# Patient Record
Sex: Male | Born: 1960 | Race: White | Hispanic: No | Marital: Married | Smoking: Never smoker
Health system: Southern US, Community
[De-identification: ages and names within clinical notes are randomized; demographics above are authoritative.]

## PROBLEM LIST (undated history)

## (undated) DIAGNOSIS — E785 Hyperlipidemia, unspecified: Secondary | ICD-10-CM

## (undated) DIAGNOSIS — Z860100 Personal history of colon polyps, unspecified: Secondary | ICD-10-CM

## (undated) DIAGNOSIS — I10 Essential (primary) hypertension: Secondary | ICD-10-CM

## (undated) HISTORY — PX: POLYPECTOMY: SHX149

## (undated) HISTORY — DX: Hyperlipidemia, unspecified: E78.5

## (undated) HISTORY — DX: Essential (primary) hypertension: I10

## (undated) HISTORY — PX: VASECTOMY: SHX75

## (undated) HISTORY — DX: Personal history of colonic polyps: Z86.010

## (undated) HISTORY — PX: COLONOSCOPY: SHX174

## (undated) HISTORY — PX: UPPER GASTROINTESTINAL ENDOSCOPY: SHX188

## (undated) HISTORY — DX: Personal history of colon polyps, unspecified: Z86.0100

---

## 2001-01-02 ENCOUNTER — Encounter: Admission: RE | Admit: 2001-01-02 | Discharge: 2001-01-02 | Payer: Self-pay | Admitting: Family Medicine

## 2001-01-02 ENCOUNTER — Encounter: Payer: Self-pay | Admitting: Family Medicine

## 2004-07-15 ENCOUNTER — Encounter: Admission: RE | Admit: 2004-07-15 | Discharge: 2004-07-15 | Payer: Self-pay | Admitting: Gastroenterology

## 2010-09-08 ENCOUNTER — Encounter (INDEPENDENT_AMBULATORY_CARE_PROVIDER_SITE_OTHER): Payer: Self-pay | Admitting: *Deleted

## 2010-10-04 ENCOUNTER — Encounter (INDEPENDENT_AMBULATORY_CARE_PROVIDER_SITE_OTHER): Payer: Self-pay

## 2010-10-05 ENCOUNTER — Ambulatory Visit: Payer: Self-pay | Admitting: Gastroenterology

## 2010-10-12 ENCOUNTER — Telehealth: Payer: Self-pay | Admitting: Gastroenterology

## 2010-10-19 ENCOUNTER — Ambulatory Visit: Payer: Self-pay | Admitting: Gastroenterology

## 2010-10-25 ENCOUNTER — Encounter: Payer: Self-pay | Admitting: Gastroenterology

## 2010-11-28 ENCOUNTER — Encounter: Payer: Self-pay | Admitting: Gastroenterology

## 2010-12-09 NOTE — Procedures (Signed)
Summary: Colonoscopy  Patient: Stephen Hines Note: All result statuses are Final unless otherwise noted.  Tests: (1) Colonoscopy (COL)   COL Colonoscopy           DONE     [facility]     [address]     [place], [place]  [address]           COLONOSCOPY PROCEDURE REPORT           PATIENT:  Stephen Hines, Stephen Hines  MR#:  [id number]     BIRTHDATE:  26-Jan-1961, 49 yrs. old  GENDER:  male           ENDOSCOPIST:  Barbette Hair. Arlyce Dice, MD     Referred by:  Bradd Canary, M.D.           PROCEDURE DATE:  10/19/2010     PROCEDURE:  Colonoscopy with snare polypectomy     ASA CLASS:  Class I     INDICATIONS:  1) Routine Risk Screening           MEDICATIONS:   Fentanyl 75 mcg IV, Versed 8 mg IV           DESCRIPTION OF PROCEDURE:   After the risks benefits and     alternatives of the procedure were thoroughly explained, informed     consent was obtained.  Digital rectal exam was performed and     revealed no abnormalities.   The [id number] [id number] endoscope was     introduced through the anus and advanced to the cecum, which was     identified by both the appendix and ileocecal valve, without     limitations.  The quality of the prep was good, using MoviPrep.     The instrument was then slowly withdrawn as the colon was fully     examined.     <<PROCEDUREIMAGES>>           FINDINGS:  A sessile polyp was found in the distal transverse     colon. It was 6 mm in size. Polyp was snared without cautery.     Retrieval was successful (see image1 and image2). snare polyp     This was otherwise a normal examination of the colon (see image4,     image5, image7, image9, image10, and image11).   Retroflexed views     in the rectum revealed no abnormalities.    The time to cecum =     5.50  minutes. The scope was then withdrawn (time =  6.50  min)     from the patient and the procedure completed.           COMPLICATIONS:  None           ENDOSCOPIC IMPRESSION:     1) 6 mm sessile polyp in the distal  transverse colon     2) Otherwise normal examination     RECOMMENDATIONS:     1) If the polyp(s) removed today are proven to be adenomatous     (pre-cancerous) polyps, you will need a repeat colonoscopy in 5     years. Otherwise you should continue to follow colorectal cancer     screening guidelines for "routine risk" patients with colonoscopy     in 10 years.           REPEAT EXAM:   You will receive a letter from Dr. Arlyce Dice in 1-2     weeks, after reviewing the final pathology, with followup  recommendations.           ______________________________     Barbette Hair Arlyce Dice, MD           CC:           n.     eSIGNED:   Barbette Hair. Kaplan at 10/19/2010 11:29 AM           Lucienne Minks, [id number]  Note: An exclamation mark (!) indicates a result that was not dispersed into the flowsheet. Document Creation Date: 10/19/2010 11:29 AM _______________________________________________________________________  (1) Order result status: Final Collection or observation date-time: 10/19/2010 11:25 Requested date-time:  Receipt date-time:  Reported date-time:  Referring Physician:   Ordering Physician: Melvia Heaps [phone]) Specimen Source:  Source: Launa Grill Order Number: [id number] Lab site:   Appended Document: Colonoscopy     Procedures Next Due Date:    Colonoscopy: 10/2015

## 2010-12-09 NOTE — Letter (Signed)
Summary: [facility] Instructions  [facility]  [address] [place], [place] [address]   Phone: [phone]  Fax: [phone]       Stephen Hines    [birth year]    MRN: [id number]        Procedure Day /Date:  10/19/10     Arrival Time:  9:30am      Procedure Time:  10:30am     Location of Procedure:                    _x _  [facility] (4th Floor)                        PREPARATION FOR COLONOSCOPY WITH MOVIPREP   Starting 5 days prior to your procedure _ 12/8/11_ do not eat nuts, seeds, popcorn, corn, beans, peas,  salads, or any raw vegetables.  Do not take any fiber supplements (e.g. Metamucil, Citrucel, and Benefiber).  THE DAY BEFORE YOUR PROCEDURE         DATE:  10/18/10  DAY:  Monday  1.  Drink clear liquids the entire day-NO SOLID FOOD  2.  Do not drink anything colored red or purple.  Avoid juices with pulp.  No orange juice.  3.  Drink at least 64 oz. (8 glasses) of fluid/clear liquids during the day to prevent dehydration and help the prep work efficiently.  CLEAR LIQUIDS INCLUDE: Water Jello Ice Popsicles Tea (sugar ok, no milk/cream) Powdered fruit flavored drinks Coffee (sugar ok, no milk/cream) Gatorade Juice: apple, white grape, white cranberry  Lemonade Clear bullion, consomm, broth Carbonated beverages (any kind) Strained chicken noodle soup Hard Candy                             4.  In the morning, mix first dose of MoviPrep solution:    Empty 1 Pouch A and 1 Pouch B into the disposable container    Add lukewarm drinking water to the top line of the container. Mix to dissolve    Refrigerate (mixed solution should be used within 24 hrs)  5.  Begin drinking the prep at 5:00 p.m. The MoviPrep container is divided by 4 marks.   Every 15 minutes drink the solution down to the next mark (approximately 8 oz) until the full liter is complete.   6.  Follow completed prep with 16 oz of clear liquid of your choice (Nothing  red or purple).  Continue to drink clear liquids until bedtime.  7.  Before going to bed, mix second dose of MoviPrep solution:    Empty 1 Pouch A and 1 Pouch B into the disposable container    Add lukewarm drinking water to the top line of the container. Mix to dissolve    Refrigerate  THE DAY OF YOUR PROCEDURE      DATE:   10/19/10  DAY:  Tuesday  Beginning at 5:30 a.m. (5 hours before procedure):         1. Every 15 minutes, drink the solution down to the next mark (approx 8 oz) until the full liter is complete.  2. Follow completed prep with 16 oz. of clear liquid of your choice.    3. You may drink clear liquids until  8:30am  (2 HOURS BEFORE PROCEDURE).   MEDICATION INSTRUCTIONS  Unless otherwise instructed, you should take regular prescription medications with a small sip of water  as early as possible the morning of your procedure.         OTHER INSTRUCTIONS  You will need a responsible adult at least 51 years of age to accompany you and drive you home.   This person must remain in the waiting room during your procedure.  Wear loose fitting clothing that is easily removed.  Leave jewelry and other valuables at home.  However, you may wish to bring a book to read or  an iPod/MP3 player to listen to music as you wait for your procedure to start.  Remove all body piercing jewelry and leave at home.  Total time from sign-in until discharge is approximately 2-3 hours.  You should go home directly after your procedure and rest.  You can resume normal activities the  day after your procedure.  The day of your procedure you should not:   Drive   Make legal decisions   Operate machinery   Drink alcohol   Return to work  You will receive specific instructions about eating, activities and medications before you leave.    The above instructions have been reviewed and explained to me by   Ulis Rias RN  October 05, 2010 10:44 AM     I fully  understand and can verbalize these instructions _____________________________ Date _________

## 2010-12-09 NOTE — Miscellaneous (Signed)
Summary: Lec previsit  Clinical Lists Changes  Medications: Added new medication of MOVIPREP 100 GM  SOLR (PEG-KCL-NACL-NASULF-NA ASC-C) As per prep instructions. - Signed Rx of MOVIPREP 100 GM  SOLR (PEG-KCL-NACL-NASULF-NA ASC-C) As per prep instructions.;  #1 x 0;  Signed;  Entered by: Ulis Rias RN;  Authorized by: Louis Meckel MD;  Method used: Electronically to [facility] [phone]*, [address] [place], [place]  [address], Ph: [phone] or [phone], Fax: [phone] Observations: Added new observation of NKA: T (10/05/2010 10:13)    Prescriptions: MOVIPREP 100 GM  SOLR (PEG-KCL-NACL-NASULF-NA ASC-C) As per prep instructions.  #1 x 0   Entered by:   Ulis Rias RN   Authorized by:   Louis Meckel MD   Signed by:   Ulis Rias RN on 10/05/2010   Method used:   [facility]ronically to        [facility] [phone]* (retail)       [address]       [place], [place]  [address]       Ph: [phone] or [phone]       Fax: [phone]   RxID:   [id number]

## 2010-12-09 NOTE — Progress Notes (Signed)
Summary: Questions  Phone Note Call from Patient Call back at [phone]   Caller: Patient Call For: Dr. Arlyce Dice Reason for Call: Talk to Nurse Summary of Call: pt has colon scheduled for next week and wants to make sure there is no problem with him getting his blood drawn this afternoon Initial call taken by: [place] Johnson,  October 12, 2010 2:01 PM  Follow-up for Phone Call        All questions from pt answered Follow-up by: Karl Bales RN,  October 12, 2010 2:17 PM

## 2010-12-09 NOTE — Letter (Signed)
Summary: Patient Notice- Polyp Results  [facility]  [address] [place], [place] [address]   Phone: [phone]  Fax: [phone]        October 25, 2010 MRN: [id number]    AAIDEN DEPOY [address] [place], [place]  [address]    Dear Mr. SHEPHERD,  I am pleased to inform you that the colon polyp(s) removed during your recent colonoscopy was (were) found to be benign (no cancer detected) upon pathologic examination.  I recommend you have a repeat colonoscopy examination in 5_ years to look for recurrent polyps, as having colon polyps increases your risk for having recurrent polyps or even colon cancer in the future.  Should you develop new or worsening symptoms of abdominal pain, bowel habit changes or bleeding from the rectum or bowels, please schedule an evaluation with either your primary care physician or with me.  Additional information/recommendations:  __ No further action with gastroenterology is needed at this time. Please      follow-up with your primary care physician for your other healthcare      needs.  __ Please call [phone] to schedule a return visit to review your      situation.  __ Please keep your follow-up visit as already scheduled.  _x_ Continue treatment plan as outlined the day of your exam.  Please call us if you are having persistent problems or have questions about your condition that have not been fully answered at this time.  Sincerely,  Louis Meckel MD  This letter has been electronically signed by your physician.  Appended Document: Patient Notice- Polyp Results Letter Mailed

## 2010-12-09 NOTE — Letter (Signed)
Summary: Pre Visit Letter Revised  [facility]  [address] [place], [place] [address]   Phone: [phone]  Fax: [phone]        09/08/2010 MRN: [id number] Stephen Hines [address] [place], [place]  [address]             Procedure Date:  10-19-10   Welcome to the Gastroenterology Division at [facility].    You are scheduled to see a nurse for your pre-procedure visit on 10-05-10 at 10:30A.M. on the 3rd floor at [facility], [address]  We ask that you try to arrive at our office 15 minutes prior to your appointment time to allow for check-in.  Please take a minute to review the attached form.  If you answer "Yes" to one or more of the questions on the first page, we ask that you call the person listed at your earliest opportunity.  If you answer "No" to all of the questions, please complete the rest of the form and bring it to your appointment.    Your nurse visit will consist of discussing your medical and surgical history, your immediate family medical history, and your medications.   If you are unable to list all of your medications on the form, please bring the medication bottles to your appointment and we will list them.  We will need to be aware of both prescribed and over the counter drugs.  We will need to know exact dosage information as well.    Please be prepared to read and sign documents such as consent forms, a financial agreement, and acknowledgement forms.  If necessary, and with your consent, a friend or relative is welcome to sit-in on the nurse visit with you.  Please bring your insurance card so that we may make a copy of it.  If your insurance requires a referral to see a specialist, please bring your referral form from your primary care physician.  No co-pay is required for this nurse visit.     If you cannot keep your appointment, please call [phone] to cancel or reschedule prior to your appointment date.  This  allows [place] the opportunity to schedule an appointment for another patient in need of care.    Thank you for choosing  Gastroenterology for your medical needs.  We appreciate the opportunity to care for you.  Please visit [place] at our website  to learn more about our practice.  Sincerely, The Gastroenterology Division

## 2012-09-12 ENCOUNTER — Other Ambulatory Visit: Payer: Self-pay | Admitting: Dermatology

## 2012-11-09 ENCOUNTER — Ambulatory Visit (INDEPENDENT_AMBULATORY_CARE_PROVIDER_SITE_OTHER): Payer: BC Managed Care – PPO | Admitting: Emergency Medicine

## 2012-11-09 VITALS — BP 133/82 | HR 108 | Temp 99.3°F | Resp 16 | Ht 74.0 in | Wt 266.0 lb

## 2012-11-09 DIAGNOSIS — J111 Influenza due to unidentified influenza virus with other respiratory manifestations: Secondary | ICD-10-CM

## 2012-11-09 DIAGNOSIS — R059 Cough, unspecified: Secondary | ICD-10-CM

## 2012-11-09 DIAGNOSIS — R05 Cough: Secondary | ICD-10-CM

## 2012-11-09 DIAGNOSIS — J3481 Nasal mucositis (ulcerative): Secondary | ICD-10-CM

## 2012-11-09 LAB — POCT INFLUENZA A/B
Influenza A, POC: NEGATIVE
Influenza B, POC: NEGATIVE

## 2012-11-09 MED ORDER — OSELTAMIVIR PHOSPHATE 75 MG PO CAPS: 75.0000 mg | ORAL_CAPSULE | Freq: Two times a day (BID) | ORAL | Status: DC

## 2012-11-09 MED ORDER — HYDROCOD POLST-CHLORPHEN POLST 10-8 MG/5ML PO LQCR: 5.0000 mL | Freq: Two times a day (BID) | ORAL | Status: DC | PRN

## 2012-11-09 NOTE — Patient Instructions (Addendum)

## 2012-11-09 NOTE — Progress Notes (Signed)
[facility] [address], [place] [address] [phone]  Date:  11/09/2012   Name:  Stephen Hines   DOB:  1961/06/07   MRN:  [id number]  PCP:  Barkley Bruns, MD    Chief Complaint: Illness   History of Present Illness:  Stephen Hines is a 52 y.o. very pleasant male patient who presents with the following:  Started to feel puny after work yesterday and went to bed immediately.  Today feels worse.  Myalgias, malaise.  Cough yesterday but less today. Non productive.  Some nasal congestion and drainage that is mucoid.  Fever at home and some chills.  No improvement with OTC medication.  Had a flu shot.  No sick contacts.  There is no problem list on file for this patient.   Past Medical History  Diagnosis Date  . Hyperlipidemia     Past Surgical History  Procedure Date  . Vasectomy     History  Substance Use Topics  . Smoking status: Never Smoker   . Smokeless tobacco: Not on file  . Alcohol Use: No    History reviewed. No pertinent family history.  No Known Allergies  Medication list has been reviewed and updated.  Current Outpatient Prescriptions on File Prior to Visit  Medication Sig Dispense Refill  . atorvastatin (LIPITOR) 40 MG tablet Take 40 mg by mouth daily.        Review of Systems:  As per HPI, otherwise negative.    Physical Examination: Filed Vitals:   11/09/12 1721  BP: 133/82  Pulse: 108  Temp: 99.3 F (37.4 C)  Resp: 16   Filed Vitals:   11/09/12 1721  Height: 6\' 2"  (1.88 m)  Weight: 266 lb (120.657 kg)   Body mass index is 34.15 kg/(m^2). Ideal Body Weight: Weight in (lb) to have BMI = 25: 194.3   GEN: WDWN, NAD, Non-toxic, A & O x 3 HEENT: Atraumatic, Normocephalic. Neck supple. No masses, No LAD. Ears and Nose: No external deformity. CV: RRR, No M/G/R. No JVD. No thrill. No extra heart sounds. PULM: CTA B, no wheezes, crackles, rhonchi. No retractions. No resp. distress. No accessory muscle  use. ABD: S, NT, ND, +BS. No rebound. No HSM. EXTR: No c/c/e NEURO Normal gait.  PSYCH: Normally interactive. Conversant. Not depressed or anxious appearing.  Calm demeanor.    Assessment and Plan: Influenza tamiflu IAW CDC guidance to treat suspicious cases with negative POCT  Follow up as needed   Carmelina Dane, MD  Results for orders placed in visit on 11/09/12  POCT INFLUENZA A/B      Component Value Range   Influenza A, POC Negative     Influenza B, POC Negative

## 2013-10-01 ENCOUNTER — Other Ambulatory Visit: Payer: Self-pay | Admitting: Dermatology

## 2013-10-24 ENCOUNTER — Other Ambulatory Visit: Payer: Self-pay | Admitting: Emergency Medicine

## 2014-05-22 ENCOUNTER — Ambulatory Visit (INDEPENDENT_AMBULATORY_CARE_PROVIDER_SITE_OTHER): Payer: BC Managed Care – PPO

## 2014-05-22 ENCOUNTER — Ambulatory Visit (INDEPENDENT_AMBULATORY_CARE_PROVIDER_SITE_OTHER): Payer: BC Managed Care – PPO | Admitting: Family Medicine

## 2014-05-22 VITALS — BP 126/80 | HR 79 | Temp 98.0°F | Resp 16 | Ht 73.5 in | Wt 267.0 lb

## 2014-05-22 DIAGNOSIS — M79609 Pain in unspecified limb: Secondary | ICD-10-CM

## 2014-05-22 DIAGNOSIS — M79674 Pain in right toe(s): Secondary | ICD-10-CM

## 2014-05-22 DIAGNOSIS — M7989 Other specified soft tissue disorders: Secondary | ICD-10-CM

## 2014-05-22 MED ORDER — MELOXICAM 7.5 MG PO TABS: 7.5000 mg | ORAL_TABLET | Freq: Every day | ORAL | Status: DC | PRN

## 2014-05-22 NOTE — Patient Instructions (Signed)
The joint swelling may be gout or "pseudogout".  Can take meloxicam - 1 to 2 each day. Do not combine with other antiinflammatories.  Elevate as needed. Can follow up with your primary provider to check testing for gout if this has not been tested lately - especially if any recurrence of joint swelling.  Recheck in next week if not much improved - sooner if worse.   Return to the clinic or go to the nearest emergency room if any of your symptoms worsen or new symptoms occur.

## 2014-05-22 NOTE — Progress Notes (Addendum)
Subjective:  This chart was scribed for Stephen Ray, MD, by Stephen Hines, ED Scribe. This  patient's care was started at 8:17 PM.   Authored by Stephen Forehand, MD - unable to change in [facility].    Patient ID: Stephen Hines, male    DOB: [birth year], 53 y.o.   MRN: [id number]  HPI   Stephen Hines is a 53 y.o. male PCP: Stephen Good, MD  The pt reports persistent pain to his right great toe which began 8 days ago upon awakening and has been associated with intermittent swelling. He has used IBU with moderate relief; he uses 400 mg IBU every six hours. He denies drainage from the toe. He denies fever, chills, or cold symptoms. He denies known injuries or bites to the toe. He also denies a h/o gout. He reports recent increased ambulation as part of his daily activities. He also reports increased red meat consumption recently while on a vacation in [place].   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   There are no active problems to display for this patient.  Past Medical History  Diagnosis Date   Hyperlipidemia    Past Surgical History  Procedure Laterality Date   Vasectomy     No Known Allergies  Prior to Admission medications   Medication Sig Start Date End Date Taking? Authorizing Provider  atorvastatin (LIPITOR) 40 MG tablet Take 40 mg by mouth daily.   Yes Historical Provider, MD  chlorpheniramine-HYDROcodone (TUSSIONEX PENNKINETIC ER) 10-8 MG/5ML LQCR Take 5 mLs by mouth every 12 (twelve) hours as needed (cough). 11/09/12   Stephen Carwin, MD  oseltamivir (TAMIFLU) 75 MG capsule Take 1 capsule (75 mg total) by mouth 2 (two) times daily. 11/09/12   Stephen Carwin, MD   Review of Systems  Constitutional: Negative for fever and chills.  HENT: Negative for congestion, rhinorrhea, sneezing and sore throat.   Respiratory: Negative for cough.   Musculoskeletal: Positive for myalgias.  Skin: Positive for color change. Negative for rash.      Objective:   Physical  Exam  Nursing note and vitals reviewed. Constitutional: He is oriented to person, place, and time. He appears well-developed and well-nourished. No distress.  HENT:  Head: Normocephalic and atraumatic.  Eyes: Conjunctivae and EOM are normal.  Neck: Neck supple. No tracheal deviation present.  Cardiovascular: Normal rate.   Pulmonary/Chest: Effort normal. No respiratory distress.  Musculoskeletal: Normal range of motion.  Compared to left foot, right great toe has some soft tissue swelling, some erythema and warmth over dorsal IP joint. TP joint is unaffected. Proximal and lateral nail folds are unaffected. No wounds to base of foot. No skin break down on plantar or medial surface of great toe. No rash. No proximal erythematous streaks.   Neurological: He is alert and oriented to person, place, and time.  Skin: Skin is warm and dry.  Psychiatric: He has a normal mood and affect. His behavior is normal.    Filed Vitals:   05/22/14 2013  BP: 126/80  Pulse: 79  Temp: 98 F (36.7 C)  TempSrc: Oral  Resp: 16  Height: 6' 1.5" (1.867 m)  Weight: 267 lb (121.11 kg)  SpO2: 96%   [facility] reading (PRIMARY) by  Dr. Carlota Hines: R great toe.: no acute findings, no fx.      Assessment & Plan:   Stephen Hines is a 53 y.o. male Great toe pain, right - Plan: DG Toe Great Right, meloxicam (MOBIC) 7.5 MG  tablet  Pain and swelling of toe of right foot - Plan: meloxicam (MOBIC) 7.5 MG tablet  Possible initial onset of gout or other inflammatory arthropathy. No sign of infection, and skin surrounding nail unaffected - doubt infection. Prior increased steak intake - may be gout.   -Change to mobic 1-2 qd, elevation as able, sx care and follow up with PCP for further gout testing if has not had recent uric acid testing.   -rtc precautions.     Meds ordered this encounter  Medications   meloxicam (MOBIC) 7.5 MG tablet    Sig: Take 1-2 tablets (7.5-15 mg total) by mouth daily as needed for pain.     Dispense:  30 tablet    Refill:  0   Patient Instructions  The joint swelling may be gout or "pseudogout".  Can take meloxicam - 1 to 2 each day. Do not combine with other antiinflammatories.  Elevate as needed. Can follow up with your primary provider to check testing for gout if this has not been tested lately - especially if any recurrence of joint swelling.  Recheck in next week if not much improved - sooner if worse.   Return to the clinic or go to the nearest emergency room if any of your symptoms worsen or new symptoms occur.     I personally performed the services described in this documentation, which was scribed in my presence. The recorded information has been reviewed and considered, and addended by me as needed.

## 2014-06-06 ENCOUNTER — Ambulatory Visit: Payer: BC Managed Care – PPO | Admitting: Podiatrist

## 2014-09-15 ENCOUNTER — Ambulatory Visit (INDEPENDENT_AMBULATORY_CARE_PROVIDER_SITE_OTHER): Payer: BC Managed Care – PPO | Admitting: Family Medicine

## 2014-09-15 VITALS — BP 124/78 | HR 81 | Temp 98.7°F | Resp 18 | Ht 74.0 in | Wt 269.2 lb

## 2014-09-15 DIAGNOSIS — J029 Acute pharyngitis, unspecified: Secondary | ICD-10-CM

## 2014-09-15 DIAGNOSIS — R05 Cough: Secondary | ICD-10-CM

## 2014-09-15 DIAGNOSIS — R059 Cough, unspecified: Secondary | ICD-10-CM

## 2014-09-15 LAB — POCT RAPID STREP A (OFFICE): RAPID STREP A SCREEN: NEGATIVE

## 2014-09-15 MED ORDER — HYDROCODONE-HOMATROPINE 5-1.5 MG/5ML PO SYRP: 5.0000 mL | ORAL_SOLUTION | ORAL | Status: DC | PRN

## 2014-09-15 MED ORDER — BENZONATATE 100 MG PO CAPS: 100.0000 mg | ORAL_CAPSULE | Freq: Three times a day (TID) | ORAL | Status: DC | PRN

## 2014-09-15 NOTE — Progress Notes (Signed)
Subjective: 53 year old man who is generally healthy. He had his physical exam a week or 2 ago and was normal. The last couple days he is started getting ill with a slight sore throat. He did okay Saturday and Sunday, but today he felt lousy. He did not go to work. He has not had a documented fever. He is coughing some. Has had a runny nose, and is bringing up some mucus.  Objective: No major distress. His TMs are normal. Throat erythematous without exudate. Neck supple without major nodes. His chest is clear to auscultation. Heart regular without murmurs.  Assessment: Pharyngitis Cough  Plan: Strep test and culture if needed  Results for orders placed or performed in visit on 09/15/14  POCT rapid strep A  Result Value Ref Range   Rapid Strep A Screen Negative Negative   Will treat symptomatically. Culture is pending.

## 2014-09-15 NOTE — Patient Instructions (Signed)
Drink plenty of fluids and get enough rest  Tylenol or ibuprofen for throat discomfort  Gargle and lozenges as needed  Benzonatate cough pills 1-23 times daily as needed for cough (nonsedating)  Hycodan cough syrup 1 teaspoon every 4 hours as needed for cough (sedating)  Return if not improving

## 2014-09-18 LAB — CULTURE, GROUP A STREP: ORGANISM ID, BACTERIA: NORMAL

## 2015-05-07 ENCOUNTER — Ambulatory Visit (INDEPENDENT_AMBULATORY_CARE_PROVIDER_SITE_OTHER): Payer: BLUE CROSS/BLUE SHIELD | Admitting: Physician Assistant

## 2015-05-07 VITALS — BP 122/76 | HR 80 | Temp 98.8°F | Resp 17 | Ht 73.5 in | Wt 261.8 lb

## 2015-05-07 DIAGNOSIS — N50811 Right testicular pain: Secondary | ICD-10-CM

## 2015-05-07 DIAGNOSIS — R350 Frequency of micturition: Secondary | ICD-10-CM

## 2015-05-07 DIAGNOSIS — N508 Other specified disorders of male genital organs: Secondary | ICD-10-CM

## 2015-05-07 DIAGNOSIS — N451 Epididymitis: Secondary | ICD-10-CM | POA: Diagnosis not present

## 2015-05-07 LAB — POCT UA - MICROSCOPIC ONLY
Casts, Ur, LPF, POC: NEGATIVE
Crystals, Ur, HPF, POC: NEGATIVE
MUCUS UA: NEGATIVE
RBC, urine, microscopic: NEGATIVE
Yeast, UA: NEGATIVE

## 2015-05-07 LAB — POCT URINALYSIS DIPSTICK
Bilirubin, UA: NEGATIVE
Blood, UA: NEGATIVE
GLUCOSE UA: NEGATIVE
Ketones, UA: NEGATIVE
Leukocytes, UA: NEGATIVE
NITRITE UA: NEGATIVE
PH UA: 6
Protein, UA: NEGATIVE
Spec Grav, UA: 1.015
Urobilinogen, UA: 0.2

## 2015-05-07 LAB — POCT CBC
Granulocyte percent: 65.6 %G (ref 37–80)
HCT, POC: 50.3 % (ref 43.5–53.7)
HEMOGLOBIN: 16.8 g/dL (ref 14.1–18.1)
Lymph, poc: 2.8 (ref 0.6–3.4)
MCH, POC: 28.9 pg (ref 27–31.2)
MCHC: 33.4 g/dL (ref 31.8–35.4)
MCV: 86.6 fL (ref 80–97)
MID (CBC): 0.3 (ref 0–0.9)
MPV: 7 fL (ref 0–99.8)
POC GRANULOCYTE: 5.9 (ref 2–6.9)
POC LYMPH PERCENT: 31.1 %L (ref 10–50)
POC MID %: 3.3 %M (ref 0–12)
Platelet Count, POC: 264 10*3/uL (ref 142–424)
RBC: 5.8 M/uL (ref 4.69–6.13)
RDW, POC: 13.8 %
WBC: 9 10*3/uL (ref 4.6–10.2)

## 2015-05-07 MED ORDER — LEVOFLOXACIN 500 MG PO TABS: 500.0000 mg | ORAL_TABLET | Freq: Every day | ORAL | Status: AC

## 2015-05-07 NOTE — Patient Instructions (Signed)
Take antibiotic once a day for 10 days. If you develop pain along tendons, stop immediately. Drink plenty of water - at least 64 oz per day. Applying ice and elevating your scrotum can help symptoms. Ibuprofen/tylenol for pain. I will call you with your lab results. Return if you are not starting to get better in 1 week or at any point if symptoms worsen.   Epididymitis Epididymitis is a swelling (inflammation) of the epididymis. The epididymis is a cord-like structure along the back part of the testicle. Epididymitis is usually, but not always, caused by infection. This is usually a sudden problem beginning with chills, fever and pain behind the scrotum and in the testicle. There may be swelling and redness of the testicle. DIAGNOSIS  Physical examination will reveal a tender, swollen epididymis. Sometimes, cultures are obtained from the urine or from prostate secretions to help find out if there is an infection or if the cause is a different problem. Sometimes, blood work is performed to see if your white blood cell count is elevated and if a germ (bacterial) or viral infection is present. Using this knowledge, an appropriate medicine which kills germs (antibiotic) can be chosen by your caregiver. A viral infection causing epididymitis will most often go away (resolve) without treatment. HOME CARE INSTRUCTIONS   Hot sitz baths for 20 minutes, 4 times per day, may help relieve pain.  Only take over-the-counter or prescription medicines for pain, discomfort or fever as directed by your caregiver.  Take all medicines, including antibiotics, as directed. Take the antibiotics for the full prescribed length of time even if you are feeling better.  It is very important to keep all follow-up appointments. SEEK IMMEDIATE MEDICAL CARE IF:   You have a fever.  You have pain not relieved with medicines.  You have any worsening of your problems.  Your pain seems to come and go.  You develop  pain, redness, and swelling in the scrotum and surrounding areas. MAKE SURE YOU:   Understand these instructions.  Will watch your condition.  Will get help right away if you are not doing well or get worse. Document Released: 10/21/2000 Document Revised: 01/16/2012 Document Reviewed: 09/10/2009 [facility] Patient Information 2015 [place], [place]. This information is not intended to replace advice given to you by your health care provider. Make sure you discuss any questions you have with your health care provider.

## 2015-05-07 NOTE — Progress Notes (Signed)
[facility] [address], [place] [address] [phone]  Date:  05/07/2015   Name:  Stephen Hines   DOB:  08/27/1961   MRN:  [id number]  PCP:  Pauline Good, MD    Chief Complaint: Pelvic Pain and Urinary Frequency   History of Present Illness:  This is a 54 y.o. male with PMH HLD who is presenting with right back and right testicular pain x 2 days. States he is getting burning pain in his right testicle that radiates up his right groin and into his lower right back. He is having urinary frequency and hesitancy. He is not having any dysuria or rectal pain. He feels a little constipated currently but still having bowel movements. States 20 years ago he had prostatitis and symptoms feel sort of similar to now. Denies testicular swelling or redness, hematuria, fever, chills, n/v.  Sexually active: yes, with wife of 20 years. No new partners Previous UTI: never Aggravating/Alleviating factors: hasn't tried anything for his symptoms  Review of Systems:  Review of Systems See HPI  There are no active problems to display for this patient.  Prior to Admission medications   Medication Sig Start Date End Date Taking? Authorizing Provider  atorvastatin (LIPITOR) 40 MG tablet Take 40 mg by mouth daily.   Yes Historical Provider, MD                  No Known Allergies  Past Surgical History  Procedure Laterality Date  . Vasectomy      History  Substance Use Topics  . Smoking status: Never Smoker   . Smokeless tobacco: Not on file  . Alcohol Use: No    History reviewed. No pertinent family history.  Medication list has been reviewed and updated.  Physical Examination:  Physical Exam  Constitutional: He is oriented to person, place, and time. He appears well-developed and well-nourished. No distress.  HENT:  Head: Normocephalic and atraumatic.  Right Ear: Hearing normal.  Left Ear: Hearing normal.  Nose: Nose normal.  Eyes: Conjunctivae  and lids are normal. Right eye exhibits no discharge. Left eye exhibits no discharge. No scleral icterus.  Cardiovascular: Normal rate, regular rhythm, normal heart sounds and normal pulses.   No murmur heard. Pulmonary/Chest: Effort normal and breath sounds normal. No respiratory distress. He has no wheezes. He has no rhonchi. He has no rales.  Abdominal: Soft. Normal appearance and bowel sounds are normal. There is tenderness (RLQ, mild). There is no CVA tenderness. Hernia confirmed negative in the right inguinal area and confirmed negative in the left inguinal area.    Genitourinary: Prostate normal and penis normal. Rectal exam shows no external hemorrhoid, no mass and no tenderness. Prostate is not enlarged and not tender. Right testis shows tenderness (epididymis, mild). Right testis shows no mass and no swelling. Cremasteric reflex is not absent on the right side. Left testis shows no mass, no swelling and no tenderness. Cremasteric reflex is not absent on the left side. Circumcised.  Musculoskeletal: Normal range of motion.  Lymphadenopathy:       Right: No inguinal adenopathy present.       Left: No inguinal adenopathy present.  Neurological: He is alert and oriented to person, place, and time.  Skin: Skin is warm, dry and intact. No lesion and no rash noted.  Psychiatric: He has a normal mood and affect. His speech is normal and behavior is normal. Thought content normal.    BP 122/76 mmHg  Pulse 80  Temp(Src) 98.8 F (37.1 C) (Oral)  Resp 17  Ht 6' 1.5" (1.867 m)  Wt 261 lb 12.8 oz (118.752 kg)  BMI 34.07 kg/m2  SpO2 98%  Results for orders placed or performed in visit on 05/07/15  POCT UA - Microscopic Only  Result Value Ref Range   WBC, Ur, HPF, POC 0-1    RBC, urine, microscopic neg    Bacteria, U Microscopic trace    Mucus, UA neg    Epithelial cells, urine per micros 0-1    Crystals, Ur, HPF, POC neg    Casts, Ur, LPF, POC neg    Yeast, UA neg   POCT urinalysis  dipstick  Result Value Ref Range   Color, UA yellow    Clarity, UA clear    Glucose, UA neg    Bilirubin, UA neg    Ketones, UA neg    Spec Grav, UA 1.015    Blood, UA neg    pH, UA 6.0    Protein, UA neg    Urobilinogen, UA 0.2    Nitrite, UA neg    Leukocytes, UA Negative Negative  POCT CBC  Result Value Ref Range   WBC 9.0 4.6 - 10.2 K/uL   Lymph, poc 2.8 0.6 - 3.4   POC LYMPH PERCENT 31.1 10 - 50 %L   MID (cbc) 0.3 0 - 0.9   POC MID % 3.3 0 - 12 %M   POC Granulocyte 5.9 2 - 6.9   Granulocyte percent 65.6 37 - 80 %G   RBC 5.80 4.69 - 6.13 M/uL   Hemoglobin 16.8 14.1 - 18.1 g/dL   HCT, POC 50.3 43.5 - 53.7 %   MCV 86.6 80 - 97 fL   MCH, POC 28.9 27 - 31.2 pg   MCHC 33.4 31.8 - 35.4 g/dL   RDW, POC 13.8 %   Platelet Count, POC 264 142 - 424 K/uL   MPV 7.0 0 - 99.8 fL   Assessment and Plan:  1. Urinary frequency 2. Testicular pain, right 3. epididymitis Will treat for presumptive right epididymitis since mildly tender on exam. Prostate non-tender. UA and CBC negative. PSA, G/C, and CMP pending. Will treat with levaquin QD x 10 days. Discussed tendon precautions. Counseled on ice and scrotal elevation. Return if not getting better in 7-10 days or at any time if symptoms worsen.  - POCT UA - Microscopic Only - POCT urinalysis dipstick - PSA - POCT CBC - GC/Chlamydia Probe Amp - Comprehensive metabolic panel - levofloxacin (LEVAQUIN) 500 MG tablet; Take 1 tablet (500 mg total) by mouth daily.  Dispense: 10 tablet; Refill: 0   Benjaman Pott. Drenda Freeze, MHS [facility]  05/07/2015

## 2015-05-08 LAB — COMPREHENSIVE METABOLIC PANEL WITH GFR
ALT: 21 U/L (ref 0–53)
AST: 16 U/L (ref 0–37)
Albumin: 4.7 g/dL (ref 3.5–5.2)
Alkaline Phosphatase: 71 U/L (ref 39–117)
BUN: 12 mg/dL (ref 6–23)
CO2: 28 meq/L (ref 19–32)
Calcium: 9.9 mg/dL (ref 8.4–10.5)
Chloride: 102 meq/L (ref 96–112)
Creat: 1.16 mg/dL (ref 0.50–1.35)
Glucose, Bld: 94 mg/dL (ref 70–99)
Potassium: 4.4 meq/L (ref 3.5–5.3)
Sodium: 143 meq/L (ref 135–145)
Total Bilirubin: 1.1 mg/dL (ref 0.2–1.2)
Total Protein: 7.5 g/dL (ref 6.0–8.3)

## 2015-05-09 LAB — PSA: PSA: 1.22 ng/mL (ref <=4.00)

## 2015-05-13 ENCOUNTER — Emergency Department (HOSPITAL_BASED_OUTPATIENT_CLINIC_OR_DEPARTMENT_OTHER)
Admission: EM | Admit: 2015-05-13 | Discharge: 2015-05-13 | Disposition: A | Discharge status: Home / Self Care | Payer: BLUE CROSS/BLUE SHIELD | Attending: Emergency Medicine | Admitting: Emergency Medicine

## 2015-05-13 ENCOUNTER — Encounter (HOSPITAL_BASED_OUTPATIENT_CLINIC_OR_DEPARTMENT_OTHER): Payer: Self-pay | Admitting: *Deleted

## 2015-05-13 DIAGNOSIS — Z792 Long term (current) use of antibiotics: Secondary | ICD-10-CM | POA: Insufficient documentation

## 2015-05-13 DIAGNOSIS — E785 Hyperlipidemia, unspecified: Secondary | ICD-10-CM | POA: Insufficient documentation

## 2015-05-13 DIAGNOSIS — Z79899 Other long term (current) drug therapy: Secondary | ICD-10-CM | POA: Diagnosis not present

## 2015-05-13 DIAGNOSIS — R1031 Right lower quadrant pain: Secondary | ICD-10-CM | POA: Insufficient documentation

## 2015-05-13 LAB — URINALYSIS, ROUTINE W REFLEX MICROSCOPIC
Bilirubin Urine: NEGATIVE
Glucose, UA: NEGATIVE mg/dL
Hgb urine dipstick: NEGATIVE
KETONES UR: NEGATIVE mg/dL
Leukocytes, UA: NEGATIVE
NITRITE: NEGATIVE
PROTEIN: NEGATIVE mg/dL
SPECIFIC GRAVITY, URINE: 1.012 (ref 1.005–1.030)
UROBILINOGEN UA: 0.2 mg/dL (ref 0.0–1.0)
pH: 7 (ref 5.0–8.0)

## 2015-05-13 LAB — GC/CHLAMYDIA PROBE AMP
CT Probe RNA: NEGATIVE
GC Probe RNA: NEGATIVE

## 2015-05-13 NOTE — ED Notes (Signed)
Patient states he has had RLQ abdominal pain since June 30th.  States he was seen at [facility] on 05/07/15 with negative results.  Has continued to have pain and today was seen by the Physician at his work, who sent him here for further evaluation.

## 2015-05-13 NOTE — ED Provider Notes (Signed)
CSN: [id number]     Arrival date & time 05/13/15  1042 History   First MD Initiated Contact with Patient 05/13/15 1136     Chief Complaint  Patient presents with  . Abdominal Pain     (Consider location/radiation/quality/duration/timing/severity/associated sxs/prior Treatment) Patient is a 54 y.o. male presenting with abdominal pain.  Abdominal Pain Pain location:  RLQ Pain quality: sharp   Pain radiation: right groin. Pain severity:  Moderate Onset quality:  Gradual Duration:  8 days Timing:  Intermittent Progression:  Waxing and waning Chronicity:  New Context comment:  Saw PCP office about a week ago, diagnosed with possible epididymitis.  currently on levofloxacin  Relieved by:  Nothing Worsened by:  Nothing tried Associated symptoms: no constipation, no diarrhea, no dysuria, no fever, no hematuria, no nausea and no vomiting     Past Medical History  Diagnosis Date  . Hyperlipidemia    Past Surgical History  Procedure Laterality Date  . Vasectomy     No family history on file. History  Substance Use Topics  . Smoking status: Never Smoker   . Smokeless tobacco: Not on file  . Alcohol Use: No    Review of Systems  Constitutional: Negative for fever.  Gastrointestinal: Positive for abdominal pain. Negative for nausea, vomiting, diarrhea and constipation.  Genitourinary: Negative for dysuria and hematuria.  All other systems reviewed and are negative.     Allergies  Review of patient's allergies indicates no known allergies.  Home Medications   Prior to Admission medications   Medication Sig Start Date End Date Taking? Authorizing Provider  atorvastatin (LIPITOR) 20 MG tablet Take 20 mg by mouth daily.   Yes Historical Provider, MD  levofloxacin (LEVAQUIN) 500 MG tablet Take 1 tablet (500 mg total) by mouth daily. 05/07/15 05/17/15 Yes Nicole Bush V, PA-C   BP 133/92 mmHg  Pulse 80  Temp(Src) 98.3 F (36.8 C)  Resp 16  Ht 6' 1.5" (1.867 m)  Wt 260 lb  (117.935 kg)  BMI 33.83 kg/m2  SpO2 97% Physical Exam  Constitutional: He is oriented to person, place, and time. He appears well-developed and well-nourished. No distress.  HENT:  Head: Normocephalic and atraumatic.  Mouth/Throat: Oropharynx is clear and moist.  Eyes: Conjunctivae are normal. Pupils are equal, round, and reactive to light. No scleral icterus.  Neck: Neck supple.  Cardiovascular: Normal rate, regular rhythm, normal heart sounds and intact distal pulses.   No murmur heard. Pulmonary/Chest: Effort normal and breath sounds normal. No stridor. No respiratory distress. He has no wheezes. He has no rales.  Abdominal: Soft. He exhibits no distension. There is no tenderness. There is no rigidity, no rebound, no guarding and no CVA tenderness. Hernia confirmed negative in the right inguinal area and confirmed negative in the left inguinal area.    Genitourinary: Penis normal. Right testis shows no mass and no tenderness. Left testis shows no mass and no tenderness.  Musculoskeletal: Normal range of motion. He exhibits no edema.  Lymphadenopathy:       Right: No inguinal adenopathy present.       Left: No inguinal adenopathy present.  Neurological: He is alert and oriented to person, place, and time.  Skin: Skin is warm and dry. No rash noted.  Psychiatric: He has a normal mood and affect. His behavior is normal.  Nursing note and vitals reviewed.   ED Course  Procedures (including critical care time) Labs Review Labs Reviewed  URINALYSIS, ROUTINE W REFLEX MICROSCOPIC (NOT AT [facility])  Imaging Review No results found.   EKG Interpretation None      MDM   Final diagnoses:  Right lower quadrant abdominal pain    54 year old male presenting with right lower quadrant abdominal pain. Has been present for about a week. Initially thought to be epididymitis, so treated with levofloxacin. He presents from his primary care office. His exam is relatively benign, with some  point tenderness to his right inguinal ligament. No pain with range of motion of leg, no rigidity, rebound, or guarding of his abdomen. He has not had fevers, nausea, vomiting, or other symptoms concerning for appendicitis, bowel obstruction, diverticulitis. Presentation also not consistent with renal stone. His labs from last week were unremarkable. We discussed imaging versus continued expectant management. I don't think they need to be repeated today. We agreed not to pursue imaging today based on his course of illness, well appearance, reassuring abdominal exam. He will follow-up with his primary doctor. Return precautions given.    Serita Grit, MD 05/13/15 1258

## 2015-05-13 NOTE — Discharge Instructions (Signed)

## 2015-09-02 ENCOUNTER — Ambulatory Visit (INDEPENDENT_AMBULATORY_CARE_PROVIDER_SITE_OTHER): Payer: BLUE CROSS/BLUE SHIELD | Admitting: Family Medicine

## 2015-09-02 VITALS — BP 104/80 | HR 75 | Temp 97.7°F | Resp 20 | Ht 74.0 in | Wt 267.0 lb

## 2015-09-02 DIAGNOSIS — R300 Dysuria: Secondary | ICD-10-CM

## 2015-09-02 DIAGNOSIS — N410 Acute prostatitis: Secondary | ICD-10-CM

## 2015-09-02 LAB — POC MICROSCOPIC URINALYSIS (UMFC): MUCUS RE: ABSENT

## 2015-09-02 LAB — POCT URINALYSIS DIP (MANUAL ENTRY)
BILIRUBIN UA: NEGATIVE
Bilirubin, UA: NEGATIVE
Glucose, UA: NEGATIVE
LEUKOCYTES UA: NEGATIVE
NITRITE UA: NEGATIVE
PH UA: 6.5
PROTEIN UA: NEGATIVE
Spec Grav, UA: <=1.005
UROBILINOGEN UA: 0.2

## 2015-09-02 MED ORDER — CIPROFLOXACIN HCL 500 MG PO TABS
500.0000 mg | ORAL_TABLET | Freq: Two times a day (BID) | ORAL | Status: DC
Start: 2015-09-02 — End: 2015-11-04

## 2015-09-02 MED ORDER — CIPROFLOXACIN HCL 500 MG PO TABS: 500.0000 mg | ORAL_TABLET | Freq: Two times a day (BID) | ORAL | Status: DC

## 2015-09-02 NOTE — Progress Notes (Signed)
Subjective:    Patient ID: Stephen Hines, male    DOB: Mar 16, 1961, 54 y.o.   MRN: [id number]  This chart was scribed for Reginia Forts, MD by Zola Button, Medical Scribe. This patient was seen in Room 14 and the patient's care was started at 8:32 PM.   09/02/2015  Back Pain   HPI  HPI Comments: Stephen Hines is a 54 y.o. male who [facility] complaining of lower abdominal discomfort radiating to his back and right groin that started a few weeks ago. Patient reports having right testicular pain, urinary frequency, and urinary urgency. He has had increased episodes of nocturia (1 episode at baseline). Patient denies fever, chills, diaphoresis, nausea, vomiting, constipation, and dysuria. He denies urinary problems at baseline. He also denies history of kidney stones. No FMHx of prostate cancer per patient. However, he does report FMHx of prostatitis. He also notes that his PSA levels have never been elevated. Patient notes that he has had recurrent flare-ups of similar symptoms for years; he last had a flare-up 4 months ago; he is usually diagnosed with prostatitis and he responds well to abx. He thinks it may be stress related. He is married. PSHx includes vasectomy; he notes the scar tissue still has some tenderness. He has an appointment with his urologist next month.  Patient used to be a English as a second language teacher, but now works in an office.  Review of Systems  Constitutional: Negative for fever, chills, diaphoresis and fatigue.  Gastrointestinal: Positive for abdominal pain. Negative for nausea, vomiting, diarrhea and constipation.  Genitourinary: Positive for urgency, frequency and testicular pain. Negative for dysuria, hematuria, flank pain, decreased urine volume, discharge, penile swelling, scrotal swelling, genital sores and penile pain.  Musculoskeletal: Positive for back pain.    Past Medical History  Diagnosis Date  . Hyperlipidemia    Past Surgical  History  Procedure Laterality Date  . Vasectomy     No Known Allergies  Social History   Social History  . Marital Status: Married    Spouse Name: N/A  . Number of Children: N/A  . Years of Education: N/A   Occupational History  . Not on file.   Social History Main Topics  . Smoking status: Never Smoker   . Smokeless tobacco: Not on file  . Alcohol Use: No  . Drug Use: No  . Sexual Activity: Not on file   Other Topics Concern  . Not on file   Social History Narrative   History reviewed. No pertinent family history.     Objective:    BP 104/80 mmHg  Pulse 75  Temp(Src) 97.7 F (36.5 C) (Oral)  Resp 20  Ht 6\' 2"  (1.88 m)  Wt 267 lb (121.11 kg)  BMI 34.27 kg/m2  SpO2 98% Physical Exam  Constitutional: He is oriented to person, place, and time. He appears well-developed and well-nourished. No distress.  HENT:  Head: Normocephalic and atraumatic.  Mouth/Throat: Oropharynx is clear and moist. No oropharyngeal exudate.  Eyes: Conjunctivae and EOM are normal. Pupils are equal, round, and reactive to light.  Neck: Normal range of motion. Neck supple. Carotid bruit is not present. No thyromegaly present.  Cardiovascular: Normal rate, regular rhythm, normal heart sounds and intact distal pulses.  Exam reveals no gallop and no friction rub.   No murmur heard. Pulmonary/Chest: Effort normal and breath sounds normal. He has no wheezes. He has no rales.  Abdominal: Soft. Bowel sounds are normal. He exhibits no  distension and no mass. There is no tenderness. There is no rebound and no guarding. Hernia confirmed negative in the right inguinal area and confirmed negative in the left inguinal area.  Genitourinary: Testes normal and penis normal. Right testis shows no mass, no swelling and no tenderness. Left testis shows no mass, no swelling and no tenderness.  Genital exam normal.  Musculoskeletal: He exhibits no edema.  Lymphadenopathy:    He has no cervical adenopathy.        Right: No inguinal adenopathy present.       Left: No inguinal adenopathy present.  Neurological: He is alert and oriented to person, place, and time. No cranial nerve deficit.  Skin: Skin is warm and dry. No rash noted. He is not diaphoretic.  Psychiatric: He has a normal mood and affect. His behavior is normal.  Nursing note and vitals reviewed.       Assessment & Plan:   1. Dysuria   2. Acute prostatitis    -Recurrent. -Rx for Cipro provided. -appointment in upcoming month with urology. -RTC for acute worsening.   Orders Placed This Encounter  Procedures  . Urine culture  . POCT urinalysis dipstick  . POCT Microscopic Urinalysis ([facility])   Meds ordered this encounter  Medications  . DISCONTD: ciprofloxacin (CIPRO) 500 MG tablet    Sig: Take 1 tablet (500 mg total) by mouth 2 (two) times daily.    Dispense:  30 tablet    Refill:  1  . ciprofloxacin (CIPRO) 500 MG tablet    Sig: Take 1 tablet (500 mg total) by mouth 2 (two) times daily.    Dispense:  30 tablet    Refill:  1    No Follow-up on file.   By signing my name below, I, Zola Button, attest that this documentation has been prepared under the direction and in the presence of Reginia Forts, MD.  Electronically Signed: Zola Button, Medical Scribe. 10/04/2015. 5:46 PM.   I personally performed the services described in this documentation, which was scribed in my presence. The recorded information has been reviewed and considered.  Kristi Elayne Guerin, M.D. [facility] [address] [place], [place]  [address] [phone] phone [phone] fax

## 2015-09-02 NOTE — Patient Instructions (Signed)

## 2015-09-04 LAB — URINE CULTURE
COLONY COUNT: NO GROWTH
Organism ID, Bacteria: NO GROWTH

## 2015-09-08 ENCOUNTER — Encounter: Payer: Self-pay | Admitting: Gastroenterology

## 2015-11-04 ENCOUNTER — Ambulatory Visit (AMBULATORY_SURGERY_CENTER): Payer: BLUE CROSS/BLUE SHIELD

## 2015-11-04 VITALS — Ht 74.0 in | Wt 267.6 lb

## 2015-11-04 DIAGNOSIS — Z860100 Personal history of colon polyps, unspecified: Secondary | ICD-10-CM

## 2015-11-04 MED ORDER — SUPREP BOWEL PREP KIT 17.5-3.13-1.6 GM/177ML PO SOLN: 1.0000 | Freq: Once | ORAL | Status: DC

## 2015-11-04 NOTE — Progress Notes (Signed)
No allergies to eggs or soy No diet/weight loss meds No home oxygen No past problems with anesthesia  Has email or internet; registered for emmi for colonoscopy

## 2015-11-18 ENCOUNTER — Encounter: Payer: BLUE CROSS/BLUE SHIELD | Admitting: Gastroenterology

## 2015-11-18 ENCOUNTER — Telehealth: Payer: Self-pay | Admitting: Gastroenterology

## 2015-11-18 NOTE — Telephone Encounter (Signed)
No that's okay, given what he reports we don't need to charge. Thanks

## 2015-12-03 ENCOUNTER — Encounter: Payer: Self-pay | Admitting: Gastroenterology

## 2016-02-04 IMAGING — CR DG TOE GREAT 2+V*R*
2 series · 2 of 2 positions shown · non-contrast
Comparison: None.

CLINICAL DATA: Pain

EXAM:
RIGHT FIRST TOE

[AP]
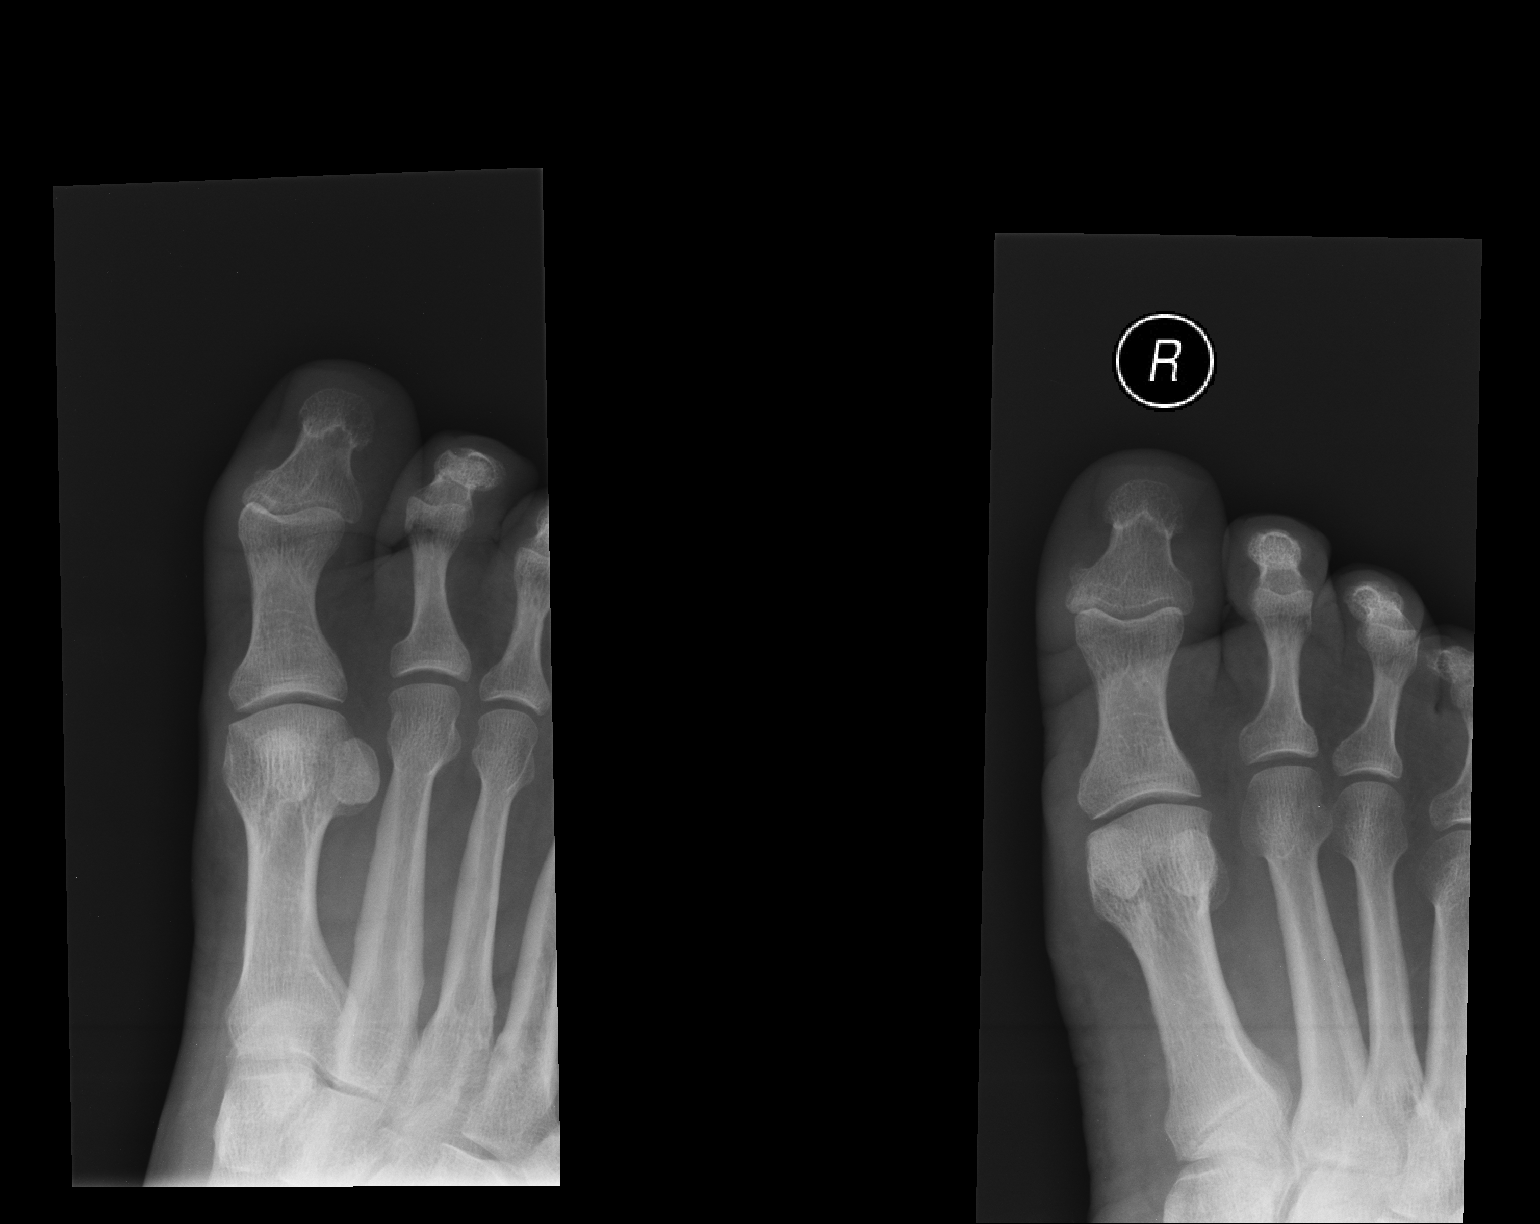

[lateral]
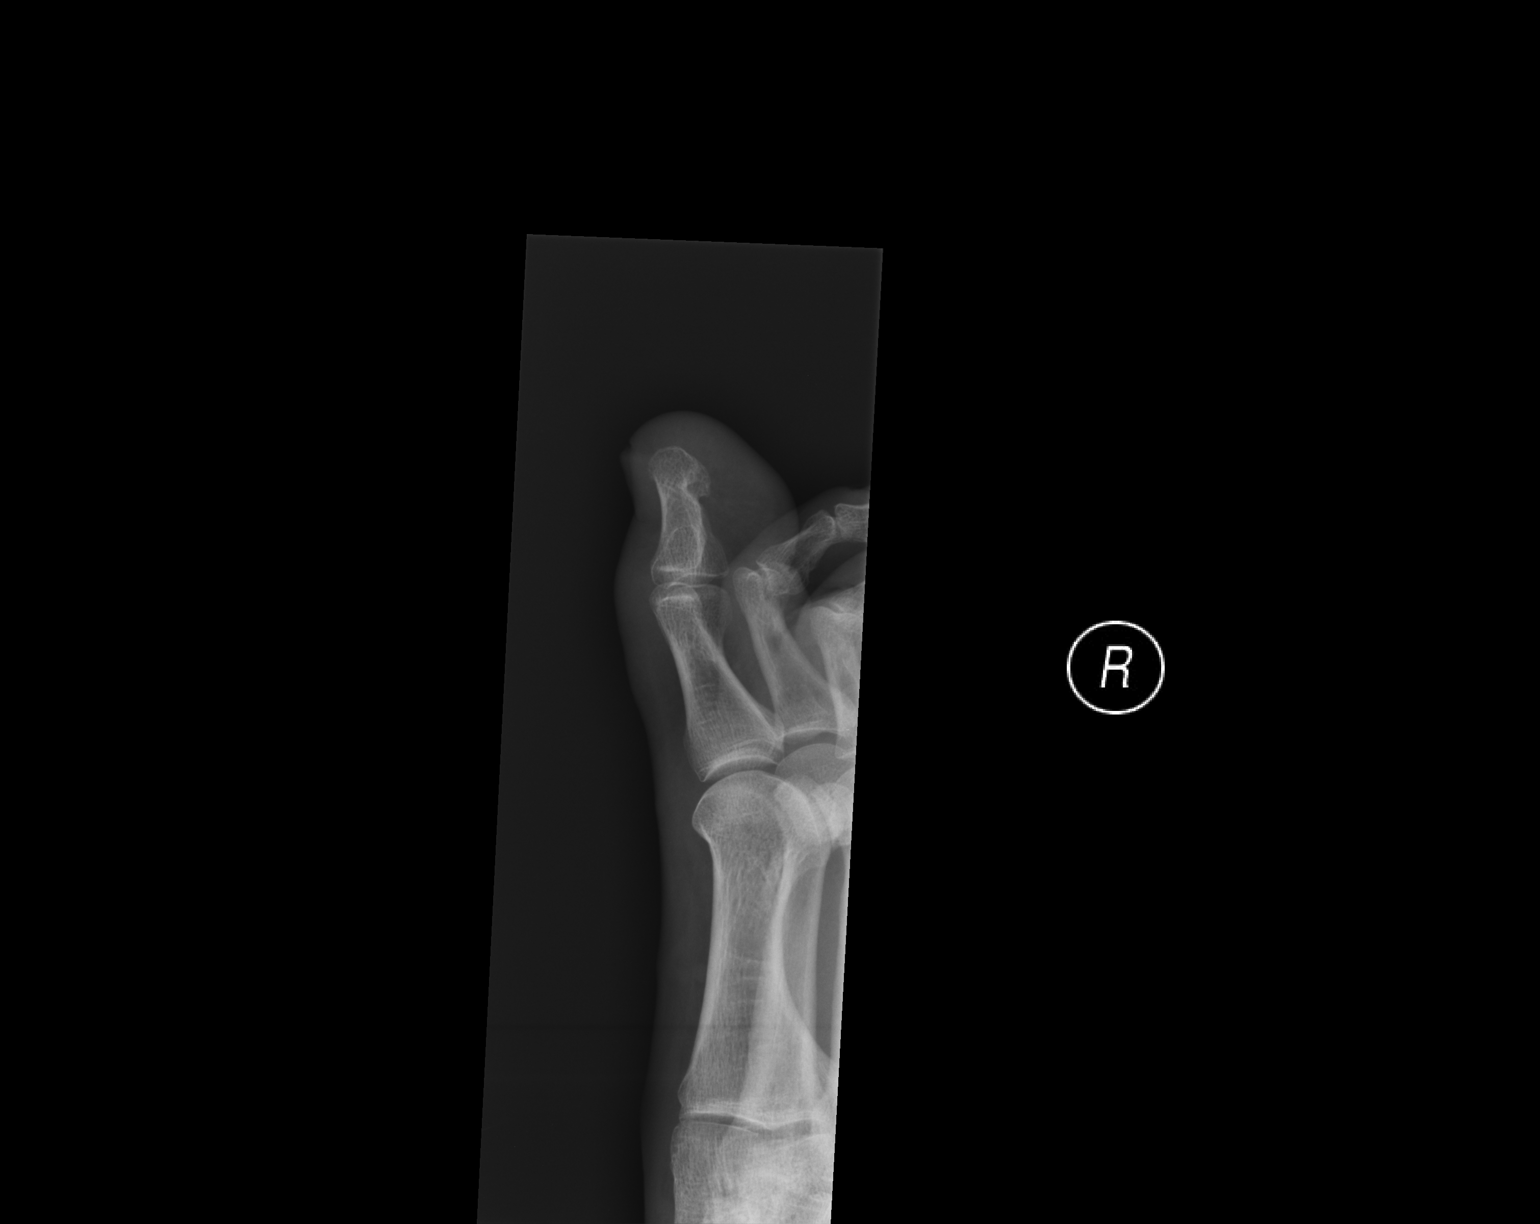

[2 of 2 positions shown; findings below may reference images not displayed]

FINDINGS: Frontal, oblique, and lateral views were obtained. No fracture or
dislocation. Joint spaces appear intact. No erosive change.
IMPRESSION: No abnormality noted.

## 2016-05-25 ENCOUNTER — Encounter: Payer: Self-pay | Admitting: Gastroenterology

## 2016-06-06 ENCOUNTER — Ambulatory Visit (AMBULATORY_SURGERY_CENTER): Payer: Self-pay | Admitting: *Deleted

## 2016-06-06 VITALS — Ht 74.0 in | Wt 264.0 lb

## 2016-06-06 DIAGNOSIS — Z86010 Personal history of colonic polyps: Secondary | ICD-10-CM

## 2016-06-06 NOTE — Progress Notes (Signed)
Patient denies any allergies to eggs or soy. Patient denies any problems with anesthesia/sedation. Patient denies any oxygen use at home and does not take any diet/weight loss medications. Patient has Suprep kit at home, He will call us if this has expired.

## 2016-06-09 ENCOUNTER — Encounter: Payer: Self-pay | Admitting: Gastroenterology

## 2016-06-23 ENCOUNTER — Encounter: Payer: Self-pay | Admitting: Gastroenterology

## 2016-06-23 ENCOUNTER — Ambulatory Visit (AMBULATORY_SURGERY_CENTER): Payer: BLUE CROSS/BLUE SHIELD | Admitting: Gastroenterology

## 2016-06-23 VITALS — BP 134/74 | HR 74 | Temp 98.4°F | Resp 17 | Ht 74.0 in | Wt 264.0 lb

## 2016-06-23 DIAGNOSIS — Z86010 Personal history of colonic polyps: Secondary | ICD-10-CM

## 2016-06-23 DIAGNOSIS — D120 Benign neoplasm of cecum: Secondary | ICD-10-CM

## 2016-06-23 DIAGNOSIS — D124 Benign neoplasm of descending colon: Secondary | ICD-10-CM

## 2016-06-23 DIAGNOSIS — D122 Benign neoplasm of ascending colon: Secondary | ICD-10-CM

## 2016-06-23 DIAGNOSIS — Z1211 Encounter for screening for malignant neoplasm of colon: Secondary | ICD-10-CM | POA: Diagnosis not present

## 2016-06-23 MED ORDER — SODIUM CHLORIDE 0.9 % IV SOLN: 500.0000 mL | INTRAVENOUS | Status: DC

## 2016-06-23 NOTE — Progress Notes (Signed)
A and O x3. Report to RN. Tolerated MAC anesthesia well. 

## 2016-06-23 NOTE — Op Note (Signed)
[name] Patient Name: Stephen Hines Procedure Date: 06/23/2016 1:59 PM MRN: [id number] Endoscopist: Mauri Pole , MD Age: 55 Referring MD:  Date of Birth: 05-09-1961 Gender: Male Account #: [id number] Procedure:                Colonoscopy Indications:              Surveillance: Personal history of adenomatous                            polyps on last colonoscopy > 5 years ago Medicines:                Monitored Anesthesia Care Procedure:                Pre-Anesthesia Assessment:                           - Prior to the procedure, a History and Physical                            was performed, and patient medications and                            allergies were reviewed. The patient's tolerance of                            previous anesthesia was also reviewed. The risks                            and benefits of the procedure and the sedation                            options and risks were discussed with the patient.                            All questions were answered, and informed consent                            was obtained. Prior Anticoagulants: The patient has                            taken no previous anticoagulant or antiplatelet                            agents. ASA Grade Assessment: II - A patient with                            mild systemic disease. After reviewing the risks                            and benefits, the patient was deemed in                            satisfactory condition to undergo the procedure.  After obtaining informed consent, the colonoscope                            was passed under direct vision. Throughout the                            procedure, the patient's blood pressure, pulse, and                            oxygen saturations were monitored continuously. The                            Model CF-[id number]) scope was introduced                            through the anus and  advanced to the the terminal                            ileum, with identification of the appendiceal                            orifice and IC valve. The colonoscopy was performed                            without difficulty. The patient tolerated the                            procedure well. The quality of the bowel                            preparation was excellent. The terminal ileum,                            ileocecal valve, appendiceal orifice, and rectum                            were photographed. Scope In: 2:20:32 PM Scope Out: 2:37:07 PM Scope Withdrawal Time: 0 hours 12 minutes 4 seconds  Total Procedure Duration: 0 hours 16 minutes 35 seconds  Findings:                 The perianal and digital rectal examinations were                            normal.                           A 3 mm polyp was found in the cecum. The polyp was                            sessile. The polyp was removed with a cold biopsy                            forceps. Resection and retrieval were complete.  A 9 mm polyp was found in the transverse colon. The                            polyp was sessile. The polyp was removed with a hot                            snare. Resection and retrieval were complete.                           A 5 mm polyp was found in the descending colon. The                            polyp was sessile. The polyp was removed with a                            cold snare. Resection and retrieval were complete.                           Non-bleeding internal hemorrhoids were found during                            retroflexion. The hemorrhoids were small.                           The exam was otherwise without abnormality. Complications:            No immediate complications. Estimated Blood Loss:     Estimated blood loss was minimal. Impression:               - One 3 mm polyp in the cecum, removed with a cold                            biopsy  forceps. Resected and retrieved.                           - One 9 mm polyp in the transverse colon, removed                            with a hot snare. Resected and retrieved.                           - One 5 mm polyp in the descending colon, removed                            with a cold snare. Resected and retrieved.                           - Non-bleeding internal hemorrhoids.                           - The examination was otherwise normal. Recommendation:           - Patient has a contact number available for  emergencies. The signs and symptoms of potential                            delayed complications were discussed with the                            patient. Return to normal activities tomorrow.                            Written discharge instructions were provided to the                            patient.                           - Resume previous diet.                           - Continue present medications.                           - Await pathology results.                           - Repeat colonoscopy in 3 - 5 years for                            surveillance based on pathology results.                           - Return to GI clinic PRN. Mauri Pole, MD 06/23/2016 2:42:25 PM This report has been signed electronically.

## 2016-06-23 NOTE — Patient Instructions (Signed)
YOU HAD AN ENDOSCOPIC PROCEDURE TODAY AT [facility]:   Refer to the procedure report that was given to you for any specific questions about what was found during the examination.  If the procedure report does not answer your questions, please call your gastroenterologist to clarify.  If you requested that your care partner not be given the details of your procedure findings, then the procedure report has been included in a sealed envelope for you to review at your convenience later.  YOU SHOULD EXPECT: Some feelings of bloating in the abdomen. Passage of more gas than usual.  Walking can help get rid of the air that was put into your GI tract during the procedure and reduce the bloating. If you had a lower endoscopy (such as a colonoscopy or flexible sigmoidoscopy) you may notice spotting of blood in your stool or on the toilet paper. If you underwent a bowel prep for your procedure, you may not have a normal bowel movement for a few days.  Please Note:  You might notice some irritation and congestion in your nose or some drainage.  This is from the oxygen used during your procedure.  There is no need for concern and it should clear up in a day or so.  SYMPTOMS TO REPORT IMMEDIATELY:   Following lower endoscopy (colonoscopy or flexible sigmoidoscopy):  Excessive amounts of blood in the stool  Significant tenderness or worsening of abdominal pains  Swelling of the abdomen that is new, acute  Fever of 100F or higher   For urgent or emergent issues, a gastroenterologist can be reached at any hour by calling [phone].   DIET:  We do recommend a small meal at first, but then you may proceed to your regular diet.  Drink plenty of fluids but you should avoid alcoholic beverages for 24 hours.  ACTIVITY:  You should plan to take it easy for the rest of today and you should NOT DRIVE or use heavy machinery until tomorrow (because of the sedation medicines used during the test).     FOLLOW UP: Our staff will call the number listed on your records the next business day following your procedure to check on you and address any questions or concerns that you may have regarding the information given to you following your procedure. If we do not reach you, we will leave a message.  However, if you are feeling well and you are not experiencing any problems, there is no need to return our call.  We will assume that you have returned to your regular daily activities without incident.  If any biopsies were taken you will be contacted by phone or by letter within the next 1-3 weeks.  Please call us at [phone] if you have not heard about the biopsies in 3 weeks.    SIGNATURES/CONFIDENTIALITY: You and/or your care partner have signed paperwork which will be entered into your electronic medical record.  These signatures attest to the fact that that the information above on your After Visit Summary has been reviewed and is understood.  Full responsibility of the confidentiality of this discharge information lies with you and/or your caretaker.   Polyps and hemorrhoid handouts provided. No NSAIDS for 2 weeks, tylenol is okay to take for pain.

## 2016-06-23 NOTE — Progress Notes (Signed)
Called to room to assist during endoscopic procedure.  Patient ID and intended procedure confirmed with present staff. Received instructions for my participation in the procedure from the performing physician.  

## 2016-06-23 NOTE — Progress Notes (Signed)
Patient c/o new onset upper abdominal symptoms and heartburn in the past few month. Denies dysphagia or odynophagia. He was started on Nexium 2 days ago by PMD. Will consider EGD for evaluation given new onset symptom if has no improvement with PPI. Patient requested to proceed with EGD along with colonoscopy but  we dont have prior auth from insurance and I was also informed that as per [facility] policy cannot add on the case and will try schedule EGD for a later date.  Damaris Hippo , MD [phone] Mon-Fri 8a-5p [phone] after 5p, weekends, holidays

## 2016-06-24 ENCOUNTER — Telehealth: Payer: Self-pay

## 2016-06-24 NOTE — Telephone Encounter (Signed)
  Follow up Call-  Call back number 06/23/2016  Post procedure Call Back phone  # [phone]  Permission to leave phone message Yes  Some recent data might be hidden    Patient was called for follow up after his procedure on 06/23/2016. I was not able to leave a message because a recording said that the number given was not a working number.

## 2016-06-29 ENCOUNTER — Encounter: Payer: Self-pay | Admitting: Gastroenterology

## 2016-08-02 ENCOUNTER — Ambulatory Visit: Payer: BLUE CROSS/BLUE SHIELD | Admitting: Gastroenterology

## 2016-09-06 ENCOUNTER — Ambulatory Visit: Payer: BLUE CROSS/BLUE SHIELD | Admitting: Gastroenterology

## 2016-09-19 ENCOUNTER — Ambulatory Visit (INDEPENDENT_AMBULATORY_CARE_PROVIDER_SITE_OTHER): Payer: BLUE CROSS/BLUE SHIELD | Admitting: Gastroenterology

## 2016-09-19 ENCOUNTER — Encounter (INDEPENDENT_AMBULATORY_CARE_PROVIDER_SITE_OTHER): Payer: Self-pay

## 2016-09-19 ENCOUNTER — Encounter: Payer: Self-pay | Admitting: Gastroenterology

## 2016-09-19 VITALS — BP 104/94 | HR 72 | Ht 74.0 in | Wt 265.4 lb

## 2016-09-19 DIAGNOSIS — R1013 Epigastric pain: Secondary | ICD-10-CM

## 2016-09-19 DIAGNOSIS — K580 Irritable bowel syndrome with diarrhea: Secondary | ICD-10-CM

## 2016-09-19 DIAGNOSIS — K219 Gastro-esophageal reflux disease without esophagitis: Secondary | ICD-10-CM | POA: Diagnosis not present

## 2016-09-19 MED ORDER — RANITIDINE HCL 300 MG PO TABS: 300.0000 mg | ORAL_TABLET | Freq: Every day | ORAL | 3 refills | Status: DC

## 2016-09-19 NOTE — Progress Notes (Signed)
Stephen Hines    [id number]    Dec 16, 1960  Primary Care Physician:KINDL,JAMES [name], MD  Referring Physician: Billy Fischer, MD [place],  [id number]  Chief complaint:  Heartburn, alternating constipation and diarrhea HPI: 55 year old male with history of chronic heartburn here for follow-up visit with complaints of persistent GERD symptoms despite taking PPI once daily. He is currently taking Nexium once a day with some breakthrough heartburn. Denies any dysphagia or odynophagia. He also complains of change in stool consistency for past few months, it started even prior to colonoscopy. He had changed his diet and is eating more salads and veggies. He seems to have more regular bowel movements in the past few weeks. Denies any nausea, vomiting, abdominal pain, melena or bright red blood per rectum     Outpatient Encounter Prescriptions as of 09/19/2016  Medication Sig  . atorvastatin (LIPITOR) 20 MG tablet Take 20 mg by mouth daily.  Marland Kitchen esomeprazole (NEXIUM) 40 MG capsule Take 40 mg by mouth daily at 12 noon.  . [DISCONTINUED] SUPREP BOWEL PREP SOLN Take 1 kit by mouth once.  . [DISCONTINUED] 0.9 %  sodium chloride infusion    No facility-administered encounter medications on file as of 09/19/2016.     Allergies as of 09/19/2016  . (No Known Allergies)    Past Medical History:  Diagnosis Date  . Hx of colonic polyps   . Hyperlipidemia     Past Surgical History:  Procedure Laterality Date  . COLONOSCOPY    . UPPER GASTROINTESTINAL ENDOSCOPY    . VASECTOMY      Family History  Problem Relation Age of Onset  . Colon cancer Neg Hx     Social History   Social History  . Marital status: Married    Spouse name: N/A  . Number of children: 2  . Years of education: N/A   Occupational History  . chemist    Social History Main Topics  . Smoking status: Never Smoker  . Smokeless tobacco: Never Used  . Alcohol use No  . Drug use: No    . Sexual activity: Not on file   Other Topics Concern  . Not on file   Social History Narrative  . No narrative on file      Review of systems: Review of Systems  Constitutional: Negative for fever and chills.  HENT: Negative.   Eyes: Negative for blurred vision.  Respiratory: Negative for cough, shortness of breath and wheezing.   Cardiovascular: Negative for chest pain and palpitations.  Gastrointestinal: as per HPI Genitourinary: Negative for dysuria, urgency, frequency and hematuria.  Musculoskeletal: Negative for myalgias, back pain and positive for joint pain (R knee).  Skin: Negative for itching and rash.  Neurological: Negative for dizziness, tremors, focal weakness, seizures and loss of consciousness.  Endo/Heme/Allergies: Positive for seasonal allergies.  Psychiatric/Behavioral: Negative for depression, suicidal ideas and hallucinations.  All other systems reviewed and are negative.   Physical Exam: Vitals:   09/19/16 0835  BP: (!) 104/94  Pulse: 72   Body mass index is 34.07 kg/m. Gen:      No acute distress HEENT:  EOMI, sclera anicteric Neck:     No masses; no thyromegaly Lungs:    Clear to auscultation bilaterally; normal respiratory effort CV:         Regular rate and rhythm; no murmurs Abd:      + bowel sounds; soft, non-tender; no palpable  masses, no distension Ext:    No edema; adequate peripheral perfusion Skin:      Warm and dry; no rash Neuro: alert and oriented x 3 Psych: normal mood and affect  Data Reviewed:  Reviewed labs, radiology imaging, old records and pertinent past GI work up   Assessment and Plan/Recommendations: 55 year old male with complaints of persistent GERD symptoms despite PPI once daily Continue Nexium 40 mg, 30 minutes before breakfast Start Zantac 300 mg at bedtime We'll schedule for EGD for evaluation The risks and benefits as well as alternatives of endoscopic procedure(s) have been discussed and reviewed. All  questions answered. The patient agrees to proceed.  Irregular bowel habits with alternating constipation and diarrhea likely IBS Start Benefiber 1 tablespoon daily 3 times a day Trial of probiotic align one capsule daily  Greater than 50% of the time used for counseling as well as treatment plan and follow-up. He had multiple questions which were answered to his satisfaction  K. Denzil Magnuson , MD [phone] Mon-Fri 8a-5p [phone] after 5p, weekends, holidays  CC: Kindl, Nelda Severe, MD

## 2016-09-19 NOTE — Patient Instructions (Signed)
You have been scheduled for an endoscopy. Please follow written instructions given to you at your visit today. If you use inhalers (even only as needed), please bring them with you on the day of your procedure. Your physician has requested that you go to www.startemmi.com and enter the access code given to you at your visit today. This web site gives a general overview about your procedure. However, you should still follow specific instructions given to you by our office regarding your preparation for the procedure.  We will send Zantac to your pharmacy  Use Benefiber 1 tablespoon daily three times a day  Use Align Probiotic 1 capsule daily for 4-6 weeks   Follow up as needed

## 2016-10-04 ENCOUNTER — Ambulatory Visit (AMBULATORY_SURGERY_CENTER): Payer: BLUE CROSS/BLUE SHIELD | Admitting: Gastroenterology

## 2016-10-04 ENCOUNTER — Encounter: Payer: Self-pay | Admitting: Gastroenterology

## 2016-10-04 VITALS — BP 137/83 | HR 88 | Temp 98.4°F | Resp 20 | Ht 74.0 in | Wt 265.0 lb

## 2016-10-04 DIAGNOSIS — K219 Gastro-esophageal reflux disease without esophagitis: Secondary | ICD-10-CM | POA: Diagnosis present

## 2016-10-04 MED ORDER — SODIUM CHLORIDE 0.9 % IV SOLN: 500.0000 mL | INTRAVENOUS | Status: DC

## 2016-10-04 NOTE — Op Note (Signed)
[place] Patient Name: Stephen Hines Procedure Date: 10/04/2016 9:59 AM MRN: [id number] Endoscopist: Mauri Pole , MD Age: 55 Referring MD:  Date of Birth: [birth year] Gender: Male Account #: [id number] Procedure:                Upper GI endoscopy Indications:              Esophageal reflux symptoms that persist despite                            appropriate therapy Medicines:                Monitored Anesthesia Care Procedure:                Pre-Anesthesia Assessment:                           - Prior to the procedure, a History and Physical                            was performed, and patient medications and                            allergies were reviewed. The patient's tolerance of                            previous anesthesia was also reviewed. The risks                            and benefits of the procedure and the sedation                            options and risks were discussed with the patient.                            All questions were answered, and informed consent                            was obtained. Prior Anticoagulants: The patient has                            taken no previous anticoagulant or antiplatelet                            agents. ASA Grade Assessment: II - A patient with                            mild systemic disease. After reviewing the risks                            and benefits, the patient was deemed in                            satisfactory condition to undergo the procedure.                           -  Prior to the procedure, a History and Physical                            was performed, and patient medications and                            allergies were reviewed. The patient's tolerance of                            previous anesthesia was also reviewed. The risks                            and benefits of the procedure and the sedation                            options and risks were discussed with the  patient.                            All questions were answered, and informed consent                            was obtained. Prior Anticoagulants: The patient has                            taken no previous anticoagulant or antiplatelet                            agents. ASA Grade Assessment: II - A patient with                            mild systemic disease. After reviewing the risks                            and benefits, the patient was deemed in                            satisfactory condition to undergo the procedure.                           After obtaining informed consent, the endoscope was                            passed under direct vision. Throughout the                            procedure, the patient's blood pressure, pulse, and                            oxygen saturations were monitored continuously. The                            Model [id number]) scope was introduced  through the mouth, and advanced to the second part                            of duodenum. The upper GI endoscopy was                            accomplished without difficulty. The patient                            tolerated the procedure well. Scope In: Scope Out: Findings:                 The esophagus was normal.                           The stomach was normal.                           The examined duodenum was normal. Complications:            No immediate complications. Estimated Blood Loss:     Estimated blood loss: none. Impression:               - Normal esophagus.                           - Normal stomach.                           - Normal examined duodenum.                           - No specimens collected. Recommendation:           - Patient has a contact number available for                            emergencies. The signs and symptoms of potential                            delayed complications were discussed with the                             patient. Return to normal activities tomorrow.                            Written discharge instructions were provided to the                            patient.                           - Resume previous diet.                           - Continue present medications.                           - No repeat upper endoscopy.                           -  Return to GI office PRN. Mauri Pole, MD 10/04/2016 10:15:46 AM This report has been signed electronically.

## 2016-10-04 NOTE — Patient Instructions (Signed)
YOU HAD AN ENDOSCOPIC PROCEDURE TODAY AT [facility]:   Refer to the procedure report that was given to you for any specific questions about what was found during the examination.  If the procedure report does not answer your questions, please call your gastroenterologist to clarify.  If you requested that your care partner not be given the details of your procedure findings, then the procedure report has been included in a sealed envelope for you to review at your convenience later.  YOU SHOULD EXPECT: Some feelings of bloating in the abdomen. Passage of more gas than usual.  Walking can help get rid of the air that was put into your GI tract during the procedure and reduce the bloating. If you had a lower endoscopy (such as a colonoscopy or flexible sigmoidoscopy) you may notice spotting of blood in your stool or on the toilet paper. If you underwent a bowel prep for your procedure, you may not have a normal bowel movement for a few days.  Please Note:  You might notice some irritation and congestion in your nose or some drainage.  This is from the oxygen used during your procedure.  There is no need for concern and it should clear up in a day or so.  SYMPTOMS TO REPORT IMMEDIATELY:   Following lower endoscopy (colonoscopy or flexible sigmoidoscopy):  Excessive amounts of blood in the stool  Significant tenderness or worsening of abdominal pains  Swelling of the abdomen that is new, acute  Fever of 100F or higher   Following upper endoscopy (EGD)  Vomiting of blood or coffee ground material  New chest pain or pain under the shoulder blades  Painful or persistently difficult swallowing  New shortness of breath  Fever of 100F or higher  Black, tarry-looking stools  For urgent or emergent issues, a gastroenterologist can be reached at any hour by calling [phone].   DIET:  We do recommend a small meal at first, but then you may proceed to your regular diet.  Drink  plenty of fluids but you should avoid alcoholic beverages for 24 hours.  ACTIVITY:  You should plan to take it easy for the rest of today and you should NOT DRIVE or use heavy machinery until tomorrow (because of the sedation medicines used during the test).    FOLLOW UP: Our staff will call the number listed on your records the next business day following your procedure to check on you and address any questions or concerns that you may have regarding the information given to you following your procedure. If we do not reach you, we will leave a message.  However, if you are feeling well and you are not experiencing any problems, there is no need to return our call.  We will assume that you have returned to your regular daily activities without incident.  If any biopsies were taken you will be contacted by phone or by letter within the next 1-3 weeks.  Please call us at [phone] if you have not heard about the biopsies in 3 weeks.    SIGNATURES/CONFIDENTIALITY: You and/or your care partner have signed paperwork which will be entered into your electronic medical record.  These signatures attest to the fact that that the information above on your After Visit Summary has been reviewed and is understood.  Full responsibility of the confidentiality of this discharge information lies with you and/or your care-partner.  Return to GI clinic as needed.  No recall endoscopy at  this time

## 2016-10-05 ENCOUNTER — Telehealth: Payer: Self-pay | Admitting: *Deleted

## 2016-10-05 DIAGNOSIS — R102 Pelvic and perineal pain: Secondary | ICD-10-CM | POA: Diagnosis not present

## 2016-10-05 DIAGNOSIS — N5201 Erectile dysfunction due to arterial insufficiency: Secondary | ICD-10-CM | POA: Diagnosis not present

## 2016-10-05 NOTE — Telephone Encounter (Signed)
  Follow up Call-  Call back number 10/04/2016 06/23/2016  Post procedure Call Back phone  # [phone]  Permission to leave phone message Yes Yes  Some recent data might be hidden     Patient questions:  Do you have a fever, pain , or abdominal swelling? No. Pain Score  0 *  Have you tolerated food without any problems? Yes.    Have you been able to return to your normal activities? Yes.    Do you have any questions about your discharge instructions: Diet   No. Medications  No. Follow up visit  No.  Do you have questions or concerns about your Care? No.  Actions: * If pain score is 4 or above: No action needed, pain <4.

## 2016-10-20 ENCOUNTER — Telehealth: Payer: Self-pay | Admitting: Gastroenterology

## 2016-10-20 NOTE — Telephone Encounter (Signed)
Advised okay for generic

## 2016-12-12 ENCOUNTER — Telehealth: Payer: Self-pay | Admitting: *Deleted

## 2016-12-12 NOTE — Telephone Encounter (Signed)
request for 90 day supply of ranitidine sent to pharmacy

## 2017-03-22 ENCOUNTER — Encounter: Payer: Self-pay | Admitting: Physician Assistant

## 2017-03-22 ENCOUNTER — Ambulatory Visit (INDEPENDENT_AMBULATORY_CARE_PROVIDER_SITE_OTHER): Payer: BLUE CROSS/BLUE SHIELD | Admitting: Physician Assistant

## 2017-03-22 VITALS — BP 130/100 | HR 77 | Temp 99.1°F | Ht 72.75 in | Wt 271.0 lb

## 2017-03-22 DIAGNOSIS — S70362A Insect bite (nonvenomous), left thigh, initial encounter: Secondary | ICD-10-CM

## 2017-03-22 DIAGNOSIS — W57XXXA Bitten or stung by nonvenomous insect and other nonvenomous arthropods, initial encounter: Secondary | ICD-10-CM

## 2017-03-22 NOTE — Patient Instructions (Signed)
Tick Bite Information, Adult Ticks are insects that draw blood for food. Most ticks live in shrubs and grassy areas. They climb onto people and animals that brush against the leaves and grasses that they rest on. Then they bite, attaching themselves to the skin. Most ticks are harmless, but some ticks carry germs that can spread to a person through a bite and cause a disease. To reduce your risk of getting a disease from a tick bite, it is important to take steps to prevent tick bites. It is also important to check for ticks after being outdoors. If you find that a tick has attached to you, watch for symptoms of disease. How can I prevent tick bites? Take these steps to help prevent tick bites when you are outdoors in an area where ticks are found:  Use insect repellent that has DEET (20% or higher), picaridin, or IR3535 in it. Use it on:  Skin that is showing.  The top of your boots.  Your pant legs.  Your sleeve cuffs.  For repellent products that contain permethrin, follow product instructions. Use these products on:  Clothing.  Gear.  Boots.  Tents.  Wear protective clothing. Long sleeves and long pants offer the best protection from ticks.  Wear light-colored clothing so you can see ticks more easily.  Tuck your pant legs into your socks.  If you go walking on a trail, stay in the middle of the trail so your skin, hair, and clothing do not touch the bushes.  Avoid walking through areas with long grass.  Check for ticks on your clothing, hair, and skin often while you are outside, and check again before you go inside. Make sure to check the places that ticks attach themselves most often. These places include the scalp, neck, armpits, waist, groin, and joint areas. Ticks that carry a disease called Lyme disease have to be attached to the skin for 24-48 hours. Checking for ticks every day will lessen your risk of this and other diseases.  When you come indoors, wash your  clothes and take a shower or a bath right away. Dry your clothes in a dryer on high heat for at least 60 minutes. This will kill any ticks in your clothes. What is the proper way to remove a tick? If you find a tick on your body, remove it as soon as possible. Removing a tick sooner rather than later can prevent germs from passing from the tick to your body. To remove a tick that is crawling on your skin but has not bitten:  Go outdoors and brush the tick off.  Remove the tick with tape or a lint roller. To remove a tick that is attached to your skin:  Wash your hands.  If you have latex gloves, put them on.  Use tweezers, curved forceps, or a tick-removal tool to gently grasp the tick as close to your skin and the tick's head as possible.  Gently pull with steady, upward pressure until the tick lets go. When removing the tick:  Take care to keep the tick's head attached to its body.  Do not twist or jerk the tick. This can make the tick's head or mouth break off.  Do not squeeze or crush the tick's body. This could force disease-carrying fluids from the tick into your body. Do not try to remove a tick with heat, alcohol, petroleum jelly, or fingernail polish. Using these methods can cause the tick to salivate and regurgitate into your  bloodstream, increasing your risk of getting a disease. What should I do after removing a tick?  Clean the bite area with soap and water, rubbing alcohol, or an iodine scrub.  If an antiseptic cream or ointment is available, apply a small amount to the bite site.  Wash and disinfect any instruments that you used to remove the tick. How should I dispose of a tick? To dispose of a live tick, use one of these methods:  Place it in rubbing alcohol.  Place it in a sealed bag or container.  Wrap it tightly in tape.  Flush it down the toilet. Contact a health care provider if:  You have symptoms of a disease after a tick bite. Symptoms of a  tick-borne disease can occur from moments after the tick bites to up to 30 days after a tick is removed. Symptoms include:  Muscle, joint, or bone pain.  Difficulty walking or moving your legs.  Numbness in the legs.  Paralysis.  Red rash around the tick bite area that is shaped like a target or a "bull's-eye."  Redness and swelling in the area of the tick bite.  Fever.  Repeated vomiting.  Diarrhea.  Weight loss.  Tender, swollen lymph glands.  Shortness of breath.  Cough.  Pain in the abdomen.  Headache.  Abnormal tiredness.  A change in your level of consciousness.  Confusion. Get help right away if:  You are not able to remove a tick.  A part of a tick breaks off and gets stuck in your skin.  Your symptoms get worse. Summary  Ticks may carry germs that can spread to a person through a bite and cause disease.  Wear protective clothing and use insect repellent to prevent tick bites. Follow product instructions.  If you find a tick on your body, remove it as soon as possible. If the tick is attached, do not try to remove with heat, alcohol, petroleum jelly, or fingernail polish.  Remove the attached tick using tweezers, curved forceps, or a tick-removal tool. Gently pull with steady, upward pressure until the tick lets go. Do not twist or jerk the tick. Do not squeeze or crush the tick's body.  If you have symptoms after being bitten by a tick, contact a health care provider. This information is not intended to replace advice given to you by your health care provider. Make sure you discuss any questions you have with your health care provider. Document Released: 10/21/2000 Document Revised: 08/05/2016 Document Reviewed: 08/05/2016 Elsevier Interactive Patient Education  2017 [employer].

## 2017-03-22 NOTE — Progress Notes (Signed)
Stephen Hines is a 56 y.o. male here for a new problem.  SCRIBE STATEMENT  History of Present Illness:   Chief Complaint  Patient presents with  . Establish Care  . Tick Removal    Establish Care:  Wants to continue yearly physicals through employer and utilize our office for acute care.   Tick Removal:  Noticed a tick in his left groin area this moring in the shower. Has recently been working in the yard with the warmer weather. Denies any redness, swelling, weakness, fatigue, or fevers. Not tender to touch.   Patient denies any prior history of tick borne illness. He is unable to estimate the duration of how long the tick has been attached.  PMHx, SurgHx, SocialHx, Medications, and Allergies were reviewed in the Visit Navigator and updated as appropriate.  Current Medications:   Current Outpatient Prescriptions:  .  atorvastatin (LIPITOR) 20 MG tablet, Take 20 mg by mouth daily., Disp: , Rfl:  .  esomeprazole (NEXIUM) 40 MG capsule, Take 40 mg by mouth daily at 12 noon., Disp: , Rfl:    Review of Systems:   Review of Systems  Constitutional: Negative.  Negative for chills, fever, malaise/fatigue and weight loss.  HENT: Negative.   Eyes: Negative.  Negative for blurred vision.  Respiratory: Negative.   Cardiovascular: Negative.  Negative for chest pain and palpitations.  Gastrointestinal: Negative.   Genitourinary: Negative.   Musculoskeletal: Negative.   Skin: Positive for rash.  Neurological: Negative.  Negative for dizziness, focal weakness and headaches.  Endo/Heme/Allergies: Negative.   Psychiatric/Behavioral: Negative.     Vitals:   Vitals:   03/22/17 0841  BP: (!) 130/100  Pulse: 77  Temp: 99.1 F (37.3 C)  TempSrc: Oral  SpO2: 98%  Weight: 271 lb (122.9 kg)  Height: 6' 0.75" (1.848 m)     Body mass index is 36 kg/m.  Physical Exam:   Physical Exam  Constitutional: He appears well-developed. He is cooperative.  Non-toxic appearance. He does  not have a sickly appearance. He does not appear ill. No distress.  Cardiovascular: Normal rate, regular rhythm, S1 normal, S2 normal, normal heart sounds and normal pulses.   No LE edema  Pulmonary/Chest: Effort normal and breath sounds normal.  Neurological: He is alert.  Skin:  Engorged tick located at left groin area with very mild surrounding redness, no tenderness. No discharge.  Nursing note and vitals reviewed.   Patient was agreeable to tick removal procedure. Tick was removed with cotton swab and water. Entire tick was removed with no evidence of head located in the patient's body. Patient tolerated procedure well. No estimated blood loss. Area was managed with small amount of bacitracin ointment.   Assessment and Plan:    Stephen Hines was seen today for establish care and tick removal.  Diagnoses and all orders for this visit:  Tick bite with subsequent removal of tick   Patient tolerated tick removal well. No complications. I was assisted by Dr. Teresa Hines during procedure. I gave patient a list of red flags and discussed that he needs to seek medical attention if he develops any of the either here or with his work employee outpatient health. No need for prophylactic antibiotics at this time.  . Reviewed expectations re: course of current medical issues. . Discussed self-management of symptoms. . Outlined signs and symptoms indicating need for more acute intervention. . Patient verbalized understanding and all questions were answered. . See orders for this visit as documented in the  electronic medical record. . Patient received an After-Visit Summary.  CMA or LPN served as scribe during this visit. History, Physical, and Plan performed by medical provider. Documentation and orders reviewed and attested to.  Stephen Coke, PA-C

## 2017-06-20 DIAGNOSIS — H43813 Vitreous degeneration, bilateral: Secondary | ICD-10-CM | POA: Diagnosis not present

## 2017-06-20 DIAGNOSIS — H11153 Pinguecula, bilateral: Secondary | ICD-10-CM | POA: Diagnosis not present

## 2017-08-01 DIAGNOSIS — H3320 Serous retinal detachment, unspecified eye: Secondary | ICD-10-CM | POA: Diagnosis not present

## 2017-08-01 DIAGNOSIS — H43813 Vitreous degeneration, bilateral: Secondary | ICD-10-CM | POA: Diagnosis not present

## 2017-08-01 DIAGNOSIS — H2511 Age-related nuclear cataract, right eye: Secondary | ICD-10-CM | POA: Diagnosis not present

## 2017-08-01 DIAGNOSIS — H33301 Unspecified retinal break, right eye: Secondary | ICD-10-CM | POA: Diagnosis not present

## 2017-08-01 DIAGNOSIS — H43391 Other vitreous opacities, right eye: Secondary | ICD-10-CM | POA: Diagnosis not present

## 2017-08-01 DIAGNOSIS — H43811 Vitreous degeneration, right eye: Secondary | ICD-10-CM | POA: Diagnosis not present

## 2017-08-01 DIAGNOSIS — H43812 Vitreous degeneration, left eye: Secondary | ICD-10-CM | POA: Diagnosis not present

## 2017-08-14 DIAGNOSIS — H43812 Vitreous degeneration, left eye: Secondary | ICD-10-CM | POA: Diagnosis not present

## 2017-08-14 DIAGNOSIS — H43811 Vitreous degeneration, right eye: Secondary | ICD-10-CM | POA: Diagnosis not present

## 2017-08-14 DIAGNOSIS — H33301 Unspecified retinal break, right eye: Secondary | ICD-10-CM | POA: Diagnosis not present

## 2017-09-18 DIAGNOSIS — B078 Other viral warts: Secondary | ICD-10-CM | POA: Diagnosis not present

## 2017-09-18 DIAGNOSIS — D1800 Hemangioma unspecified site: Secondary | ICD-10-CM | POA: Diagnosis not present

## 2017-09-18 DIAGNOSIS — L821 Other seborrheic keratosis: Secondary | ICD-10-CM | POA: Diagnosis not present

## 2017-09-18 DIAGNOSIS — D225 Melanocytic nevi of trunk: Secondary | ICD-10-CM | POA: Diagnosis not present

## 2017-09-18 DIAGNOSIS — Z86018 Personal history of other benign neoplasm: Secondary | ICD-10-CM | POA: Diagnosis not present

## 2017-10-02 DIAGNOSIS — H3320 Serous retinal detachment, unspecified eye: Secondary | ICD-10-CM | POA: Diagnosis not present

## 2017-10-02 DIAGNOSIS — H43813 Vitreous degeneration, bilateral: Secondary | ICD-10-CM | POA: Diagnosis not present

## 2017-10-13 ENCOUNTER — Ambulatory Visit: Payer: BLUE CROSS/BLUE SHIELD | Admitting: Nurse Practitioner

## 2017-10-13 ENCOUNTER — Encounter: Payer: Self-pay | Admitting: Nurse Practitioner

## 2017-10-13 VITALS — BP 144/80 | HR 84 | Ht 72.75 in | Wt 281.0 lb

## 2017-10-13 DIAGNOSIS — K219 Gastro-esophageal reflux disease without esophagitis: Secondary | ICD-10-CM

## 2017-10-13 NOTE — Patient Instructions (Signed)
If you are age 57 or older, your body mass index should be between 23-30. Your Body mass index is 37.33 kg/m. If this is out of the aforementioned range listed, please consider follow up with your Primary Care Provider.  If you are age 57 or younger, your body mass index should be between 19-25. Your Body mass index is 37.33 kg/m. If this is out of the aformentioned range listed, please consider follow up with your Primary Care Provider.   Follow up as needed.  Thank you for choosing me and [facility].   Tye Savoy, NP

## 2017-10-13 NOTE — Progress Notes (Signed)
     Chief Complaint:  Abdominal pain   HPI: Patient is a 56 year old male known to Dr. Silverio Decamp. He has a history of GERD and was doing okay until a month ago . Has been having heartburn, regurgitation and "pressure" in distal sterum / xiphoid area. No SOB. Stopped coffee and soda which has helped. Also thinks stress contributing. No dysphagia. He is compliant with PPI in am and restarted Zantac at bedtime when heartburn started. Over the last three days have resolved. He does go to bed on an empty stomach. Sleeps on two pillow, doesn't elevate HOB.      Past Medical History:  Diagnosis Date  . Hx of colonic polyps   . Hyperlipidemia     Patient's surgical history, family medical history, social history, medications and allergies were all reviewed in Epic    Physical Exam: BP (!) 144/80   Pulse 84   Ht 6' 0.75" (1.848 m)   Wt 281 lb (127.5 kg)   BMI 37.33 kg/m   GENERAL:  Well developed white male in NAD PSYCH: :Pleasant, cooperative, normal affect EENT:  conjunctiva pink, mucous membranes moist, neck supple without masses CARDIAC:  RRR, no murmur heard, no peripheral edema PULM: Normal respiratory effort, lungs CTA bilaterally, no wheezing ABDOMEN:  Nondistended, soft, nontender. No obvious masses, no hepatomegaly,  normal bowel sounds SKIN:  turgor, no lesions seen Musculoskeletal:  Normal muscle tone, normal strength NEURO: Alert and oriented x 3, no focal neurologic deficits    ASSESSMENT and PLAN:  Pleasant 56 year old with GERD and recent breakthrough sx now resolved with dietary changes and resumption of evening H2 blocker.  -continue anti-reflux measures -continue am Dexilant and H2 blocker in evening -call for recurrent problems.    Tye Savoy , NP 10/13/2017, 2:42 PM

## 2017-10-16 ENCOUNTER — Encounter: Payer: Self-pay | Admitting: Nurse Practitioner

## 2017-10-17 NOTE — Progress Notes (Signed)
Reviewed and agree with documentation and assessment and plan. K. Veena Vernessa Likes , MD   

## 2017-12-15 ENCOUNTER — Other Ambulatory Visit: Payer: Self-pay | Admitting: Gastroenterology

## 2018-06-22 ENCOUNTER — Other Ambulatory Visit: Payer: Self-pay | Admitting: Gastroenterology

## 2018-09-25 DIAGNOSIS — Z86018 Personal history of other benign neoplasm: Secondary | ICD-10-CM | POA: Diagnosis not present

## 2018-09-25 DIAGNOSIS — L814 Other melanin hyperpigmentation: Secondary | ICD-10-CM | POA: Diagnosis not present

## 2018-09-25 DIAGNOSIS — D225 Melanocytic nevi of trunk: Secondary | ICD-10-CM | POA: Diagnosis not present

## 2018-09-25 DIAGNOSIS — L821 Other seborrheic keratosis: Secondary | ICD-10-CM | POA: Diagnosis not present

## 2018-10-02 DIAGNOSIS — H43813 Vitreous degeneration, bilateral: Secondary | ICD-10-CM | POA: Diagnosis not present

## 2018-10-02 DIAGNOSIS — H3320 Serous retinal detachment, unspecified eye: Secondary | ICD-10-CM | POA: Diagnosis not present

## 2018-10-02 DIAGNOSIS — H2513 Age-related nuclear cataract, bilateral: Secondary | ICD-10-CM | POA: Diagnosis not present

## 2018-10-02 DIAGNOSIS — H10413 Chronic giant papillary conjunctivitis, bilateral: Secondary | ICD-10-CM | POA: Diagnosis not present

## 2019-04-30 ENCOUNTER — Other Ambulatory Visit: Payer: Self-pay

## 2019-04-30 ENCOUNTER — Emergency Department (HOSPITAL_BASED_OUTPATIENT_CLINIC_OR_DEPARTMENT_OTHER)
Admission: EM | Admit: 2019-04-30 | Discharge: 2019-04-30 | Disposition: A | Discharge status: Home / Self Care | Payer: BC Managed Care – PPO | Attending: Emergency Medicine | Admitting: Emergency Medicine

## 2019-04-30 ENCOUNTER — Encounter (HOSPITAL_BASED_OUTPATIENT_CLINIC_OR_DEPARTMENT_OTHER): Payer: Self-pay | Admitting: *Deleted

## 2019-04-30 DIAGNOSIS — Z23 Encounter for immunization: Secondary | ICD-10-CM | POA: Insufficient documentation

## 2019-04-30 DIAGNOSIS — Z79899 Other long term (current) drug therapy: Secondary | ICD-10-CM | POA: Insufficient documentation

## 2019-04-30 DIAGNOSIS — Z203 Contact with and (suspected) exposure to rabies: Secondary | ICD-10-CM | POA: Diagnosis not present

## 2019-04-30 DIAGNOSIS — Z2914 Encounter for prophylactic rabies immune globin: Secondary | ICD-10-CM | POA: Insufficient documentation

## 2019-04-30 DIAGNOSIS — Z209 Contact with and (suspected) exposure to unspecified communicable disease: Secondary | ICD-10-CM | POA: Diagnosis not present

## 2019-04-30 MED ORDER — TETANUS-DIPHTH-ACELL PERTUSSIS 5-2.5-18.5 LF-MCG/0.5 IM SUSP
0.5000 mL | Freq: Once | INTRAMUSCULAR | Status: AC
  Administered 2019-04-30: 0.5 mL via INTRAMUSCULAR
  Filled 2019-04-30: qty 0.5

## 2019-04-30 MED ORDER — RABIES VACCINE, PCEC IM SUSR
1.0000 mL | Freq: Once | INTRAMUSCULAR | Status: AC
  Administered 2019-04-30: 17:00:00 1 mL via INTRAMUSCULAR
  Filled 2019-04-30: qty 1

## 2019-04-30 MED ORDER — RABIES IMMUNE GLOBULIN 150 UNIT/ML IM INJ
20.0000 [IU]/kg | INJECTION | Freq: Once | INTRAMUSCULAR | Status: AC
  Administered 2019-04-30: 17:00:00 2325 [IU] via INTRAMUSCULAR
  Filled 2019-04-30: qty 16

## 2019-04-30 NOTE — ED Triage Notes (Signed)
Pt was walking x 2 nights ago and a bat flew into his head

## 2019-04-30 NOTE — ED Provider Notes (Signed)
[facility] Provider Note   CSN: [id number] Arrival date & time: 04/30/19  1609    History   Chief Complaint Chief Complaint  Patient presents with  . Bat exposure    HPI Stephen Hines is a 58 y.o. male.     HPI Patient thinks he may have had a bat fly into his forehead 2 nights ago.  Has some mild soreness to the site.  No obvious bites, scrapes or lacerations.  Denies any other symptoms.  Was speaking with his primary physician who spoke with infectious disease.  Thought it very unlikely that he was exposed to a rabid bat but recommended rabies postexposure prophylaxis.  Patient's last tetanus is been 8 years ago. Past Medical History:  Diagnosis Date  . Hx of colonic polyps   . Hyperlipidemia     There are no active problems to display for this patient.   Past Surgical History:  Procedure Laterality Date  . COLONOSCOPY    . UPPER GASTROINTESTINAL ENDOSCOPY    . VASECTOMY          Home Medications    Prior to Admission medications   Medication Sig Start Date End Date Taking? Authorizing Provider  atorvastatin (LIPITOR) 20 MG tablet Take 20 mg by mouth daily.    [provider]  esomeprazole (NEXIUM) 40 MG capsule Take 40 mg by mouth daily at 12 noon.    [provider]  ranitidine (ZANTAC) 300 MG tablet TAKE 1 TABLET BY MOUTH EVERYDAY AT BEDTIME 06/22/18   Mauri Pole, MD    Family History Family History  Problem Relation Age of Onset  . Hypertension Brother   . Colon cancer Neg Hx     Social History Social History   Tobacco Use  . Smoking status: Never Smoker  . Smokeless tobacco: Never Used  Substance Use Topics  . Alcohol use: Yes    Alcohol/week: 0.0 standard drinks  . Drug use: No     Allergies   Patient has no known allergies.   Review of Systems Review of Systems  Eyes: Negative for visual disturbance.  Skin: Negative for wound.  Neurological: Negative for syncope,  weakness, numbness and headaches.  All other systems reviewed and are negative.    Physical Exam Updated Vital Signs BP (!) 170/102   Pulse 68   Temp 98.7 F (37.1 C)   Resp 18   Ht 6' 0.5" (1.842 m)   Wt 117.9 kg   SpO2 99%   BMI 34.78 kg/m   Physical Exam Vitals signs and nursing note reviewed.  Constitutional:      Appearance: Normal appearance. He is well-developed.  HENT:     Head: Normocephalic and atraumatic.     Comments: Mild tenderness to palpation of the mid forehead without obvious deformity, swelling or skin injury. Eyes:     Pupils: Pupils are equal, round, and reactive to light.  Neck:     Musculoskeletal: Normal range of motion and neck supple.  Cardiovascular:     Rate and Rhythm: Normal rate.  Pulmonary:     Effort: Pulmonary effort is normal.  Abdominal:     Palpations: Abdomen is soft.  Musculoskeletal: Normal range of motion.        General: No tenderness.  Skin:    General: Skin is warm and dry.     Findings: No erythema or rash.  Neurological:     General: No focal deficit present.     Mental  Status: He is alert and oriented to person, place, and time.  Psychiatric:        Behavior: Behavior normal.      ED Treatments / Results  Labs (all labs ordered are listed, but only abnormal results are displayed) Labs Reviewed - No data to display  EKG None  Radiology No results found.  Procedures Procedures (including critical care time)  Medications Ordered in ED Medications  rabies vaccine (RABAVERT) injection 1 mL (has no administration in time range)  rabies immune globulin (HYPERAB/KEDRAB) injection 2,325 Units (has no administration in time range)  Tdap (BOOSTRIX) injection 0.5 mL (has no administration in time range)     Initial Impression / Assessment and Plan / ED Course  I have reviewed the triage vital signs and the nursing notes.  Pertinent labs & imaging results that were available during my care of the patient were  reviewed by me and considered in my medical decision making (see chart for details).        Tetanus updated.  Given first dose of rabies vaccine as well as immunoglobulin.  Follow-up instructions given.  Final Clinical Impressions(s) / ED Diagnoses   Final diagnoses:  Exposure to bat without known bite    ED Discharge Orders    None       Julianne Rice, MD 04/30/19 [phone]

## 2019-05-03 ENCOUNTER — Other Ambulatory Visit: Payer: Self-pay

## 2019-05-03 ENCOUNTER — Emergency Department (INDEPENDENT_AMBULATORY_CARE_PROVIDER_SITE_OTHER)
Admission: EM | Admit: 2019-05-03 | Discharge: 2019-05-03 | Disposition: A | Discharge status: Home / Self Care | Payer: BC Managed Care – PPO | Source: Home / Self Care

## 2019-05-03 DIAGNOSIS — Z203 Contact with and (suspected) exposure to rabies: Secondary | ICD-10-CM

## 2019-05-03 DIAGNOSIS — Z23 Encounter for immunization: Secondary | ICD-10-CM | POA: Diagnosis not present

## 2019-05-03 MED ORDER — RABIES VACCINE, PCEC IM SUSR
1.0000 mL | Freq: Once | INTRAMUSCULAR | Status: AC
  Administered 2019-05-03: 1 mL via INTRAMUSCULAR

## 2019-05-03 NOTE — ED Triage Notes (Signed)
Pt here for rabies shot, 1st of 3.  Seen at [facility] on Tuesday for initial visit.  Im injection given in left deltoid.  Tolerated procedure well.

## 2019-05-07 ENCOUNTER — Emergency Department (INDEPENDENT_AMBULATORY_CARE_PROVIDER_SITE_OTHER)
Admission: EM | Admit: 2019-05-07 | Discharge: 2019-05-07 | Disposition: A | Discharge status: Home / Self Care | Payer: BC Managed Care – PPO | Source: Home / Self Care

## 2019-05-07 ENCOUNTER — Other Ambulatory Visit: Payer: Self-pay

## 2019-05-07 DIAGNOSIS — Z203 Contact with and (suspected) exposure to rabies: Secondary | ICD-10-CM | POA: Diagnosis not present

## 2019-05-07 MED ORDER — RABIES VACCINE, PCEC IM SUSR
1.0000 mL | Freq: Once | INTRAMUSCULAR | Status: AC
  Administered 2019-05-07: 1 mL via INTRAMUSCULAR

## 2019-05-07 NOTE — ED Triage Notes (Signed)
Pt here today for Day 7 Rabies vaccine

## 2019-05-14 ENCOUNTER — Encounter: Payer: Self-pay | Admitting: *Deleted

## 2019-05-14 ENCOUNTER — Emergency Department (INDEPENDENT_AMBULATORY_CARE_PROVIDER_SITE_OTHER)
Admission: EM | Admit: 2019-05-14 | Discharge: 2019-05-14 | Disposition: A | Discharge status: Home / Self Care | Payer: BC Managed Care – PPO | Source: Home / Self Care

## 2019-05-14 ENCOUNTER — Other Ambulatory Visit: Payer: Self-pay

## 2019-05-14 DIAGNOSIS — Z23 Encounter for immunization: Secondary | ICD-10-CM

## 2019-05-14 MED ORDER — RABIES VACCINE, PCEC IM SUSR
1.0000 mL | Freq: Once | INTRAMUSCULAR | Status: AC
  Administered 2019-05-14: 1 mL via INTRAMUSCULAR

## 2019-05-14 NOTE — ED Triage Notes (Signed)
Pt is here today for his last rabies vaccine.

## 2019-06-10 ENCOUNTER — Encounter: Payer: Self-pay | Admitting: Gastroenterology

## 2019-06-18 DIAGNOSIS — M79672 Pain in left foot: Secondary | ICD-10-CM | POA: Diagnosis not present

## 2019-06-18 DIAGNOSIS — L600 Ingrowing nail: Secondary | ICD-10-CM | POA: Diagnosis not present

## 2019-06-18 DIAGNOSIS — B353 Tinea pedis: Secondary | ICD-10-CM | POA: Diagnosis not present

## 2019-06-18 DIAGNOSIS — M7732 Calcaneal spur, left foot: Secondary | ICD-10-CM | POA: Diagnosis not present

## 2019-06-18 DIAGNOSIS — M79671 Pain in right foot: Secondary | ICD-10-CM | POA: Diagnosis not present

## 2019-07-16 DIAGNOSIS — B351 Tinea unguium: Secondary | ICD-10-CM | POA: Diagnosis not present

## 2019-07-16 DIAGNOSIS — M79674 Pain in right toe(s): Secondary | ICD-10-CM | POA: Diagnosis not present

## 2019-07-16 DIAGNOSIS — M79675 Pain in left toe(s): Secondary | ICD-10-CM | POA: Diagnosis not present

## 2019-07-16 DIAGNOSIS — L600 Ingrowing nail: Secondary | ICD-10-CM | POA: Diagnosis not present

## 2019-08-08 ENCOUNTER — Other Ambulatory Visit: Payer: Self-pay

## 2019-08-08 DIAGNOSIS — Z20828 Contact with and (suspected) exposure to other viral communicable diseases: Secondary | ICD-10-CM | POA: Diagnosis not present

## 2019-08-08 DIAGNOSIS — Z20822 Contact with and (suspected) exposure to covid-19: Secondary | ICD-10-CM

## 2019-08-09 LAB — NOVEL CORONAVIRUS, NAA: SARS-CoV-2, NAA: NOT DETECTED

## 2019-08-13 DIAGNOSIS — L600 Ingrowing nail: Secondary | ICD-10-CM | POA: Diagnosis not present

## 2019-08-13 DIAGNOSIS — M79674 Pain in right toe(s): Secondary | ICD-10-CM | POA: Diagnosis not present

## 2019-08-13 DIAGNOSIS — B351 Tinea unguium: Secondary | ICD-10-CM | POA: Diagnosis not present

## 2019-08-13 DIAGNOSIS — M79675 Pain in left toe(s): Secondary | ICD-10-CM | POA: Diagnosis not present

## 2019-08-19 ENCOUNTER — Encounter: Payer: Self-pay | Admitting: Gastroenterology

## 2019-09-02 ENCOUNTER — Other Ambulatory Visit: Payer: Self-pay

## 2019-09-02 ENCOUNTER — Encounter: Payer: Self-pay | Admitting: Gastroenterology

## 2019-09-02 ENCOUNTER — Ambulatory Visit (AMBULATORY_SURGERY_CENTER): Payer: BC Managed Care – PPO | Admitting: *Deleted

## 2019-09-02 VITALS — Temp 97.3°F | Ht 74.0 in | Wt 264.4 lb

## 2019-09-02 DIAGNOSIS — Z86010 Personal history of colonic polyps: Secondary | ICD-10-CM

## 2019-09-02 DIAGNOSIS — Z1159 Encounter for screening for other viral diseases: Secondary | ICD-10-CM

## 2019-09-02 MED ORDER — NA SULFATE-K SULFATE-MG SULF 17.5-3.13-1.6 GM/177ML PO SOLN: 1.0000 | Freq: Once | ORAL | 0 refills | Status: AC

## 2019-09-02 NOTE — Progress Notes (Signed)
No egg or soy allergy known to patient  No issues with past sedation with any surgeries  or procedures, no intubation problems  No diet pills per patient No home 02 use per patient  No blood thinners per patient  Pt denies issues with constipation  No A fib or A flutter  EMMI video sent to pt's e mail   Due to the COVID-19 pandemic we are asking patients to follow these guidelines. Please only bring one care partner. Please be aware that your care partner may wait in the car in the parking lot or if they feel like they will be too hot to wait in the car, they may wait in the lobby on the 4th floor. All care partners are required to wear a mask the entire time (we do not have any that we can provide them), they need to practice social distancing, and we will do a Covid check for all patient's and care partners when you arrive. Also we will check their temperature and your temperature. If the care partner waits in their car they need to stay in the parking lot the entire time and we will call them on their cell phone when the patient is ready for discharge so they can bring the car to the front of the building. Also all patient's will need to wear a mask into building.  covid screening 09/11/19,9:40 am  suprep coupon provided

## 2019-09-11 ENCOUNTER — Other Ambulatory Visit: Payer: Self-pay | Admitting: Gastroenterology

## 2019-09-11 DIAGNOSIS — Z1159 Encounter for screening for other viral diseases: Secondary | ICD-10-CM | POA: Diagnosis not present

## 2019-09-11 LAB — SARS CORONAVIRUS 2 (TAT 6-24 HRS): SARS Coronavirus 2: NEGATIVE

## 2019-09-16 ENCOUNTER — Ambulatory Visit (AMBULATORY_SURGERY_CENTER): Payer: BC Managed Care – PPO | Admitting: Gastroenterology

## 2019-09-16 ENCOUNTER — Encounter: Payer: Self-pay | Admitting: Gastroenterology

## 2019-09-16 ENCOUNTER — Other Ambulatory Visit: Payer: Self-pay

## 2019-09-16 VITALS — BP 140/95 | HR 66 | Temp 98.3°F | Resp 13 | Ht 74.0 in | Wt 264.0 lb

## 2019-09-16 DIAGNOSIS — D123 Benign neoplasm of transverse colon: Secondary | ICD-10-CM | POA: Diagnosis not present

## 2019-09-16 DIAGNOSIS — Z1211 Encounter for screening for malignant neoplasm of colon: Secondary | ICD-10-CM | POA: Diagnosis not present

## 2019-09-16 DIAGNOSIS — Z86010 Personal history of colonic polyps: Secondary | ICD-10-CM | POA: Diagnosis not present

## 2019-09-16 MED ORDER — SODIUM CHLORIDE 0.9 % IV SOLN: 500.0000 mL | Freq: Once | INTRAVENOUS | Status: DC

## 2019-09-16 NOTE — Progress Notes (Signed)
Pt's states no medical or surgical changes since previsit or office visit. Temp by JB. VS by [place].

## 2019-09-16 NOTE — Progress Notes (Signed)
Called to room to assist during endoscopic procedure.  Patient ID and intended procedure confirmed with present staff. Received instructions for my participation in the procedure from the performing physician.  

## 2019-09-16 NOTE — Op Note (Signed)
[facility] Patient Name: Stephen Hines Procedure Date: 09/16/2019 8:28 AM MRN: [id number] Endoscopist: Mauri Pole , MD Age: 58 Referring MD:  Date of Birth: 01-16-1961 Gender: Male Account #: [id number] Procedure:                Colonoscopy Indications:              High risk colon cancer surveillance: Personal                            history of colonic polyps, High risk colon cancer                            surveillance: Personal history of multiple (3 or                            more) adenomas Medicines:                Monitored Anesthesia Care Procedure:                Pre-Anesthesia Assessment:                           - Prior to the procedure, a History and Physical                            was performed, and patient medications and                            allergies were reviewed. The patient's tolerance of                            previous anesthesia was also reviewed. The risks                            and benefits of the procedure and the sedation                            options and risks were discussed with the patient.                            All questions were answered, and informed consent                            was obtained. Prior Anticoagulants: The patient has                            taken no previous anticoagulant or antiplatelet                            agents. ASA Grade Assessment: II - A patient with                            mild systemic disease. After reviewing the risks  and benefits, the patient was deemed in                            satisfactory condition to undergo the procedure.                           After obtaining informed consent, the colonoscope                            was passed under direct vision. Throughout the                            procedure, the patient's blood pressure, pulse, and                            oxygen saturations were monitored continuously.  The                            Colonoscope was introduced through the anus and                            advanced to the the cecum, identified by                            appendiceal orifice and ileocecal valve. The                            colonoscopy was performed without difficulty. The                            patient tolerated the procedure well. The quality                            of the bowel preparation was adequate. The                            ileocecal valve, appendiceal orifice, and rectum                            were photographed. Scope In: 8:31:41 AM Scope Out: 8:55:35 AM Scope Withdrawal Time: 0 hours 18 minutes 15 seconds  Total Procedure Duration: 0 hours 23 minutes 54 seconds  Findings:                 The perianal and digital rectal examinations were                            normal.                           A 4 mm polyp was found in the transverse colon. The                            polyp was sessile. The polyp was removed with a  cold snare. Resection and retrieval were complete.                           Non-bleeding internal hemorrhoids were found during                            retroflexion. The hemorrhoids were small.                           A patchy area of mildly erythematous mucosa was                            found in the sigmoid colon.                           The exam was otherwise without abnormality. Complications:            No immediate complications. Estimated Blood Loss:     Estimated blood loss was minimal. Impression:               - One 4 mm polyp in the transverse colon, removed                            with a cold snare. Resected and retrieved.                           - Non-bleeding internal hemorrhoids.                           - Erythematous mucosa in the sigmoid colon.                           - The examination was otherwise normal. Recommendation:           - Patient has a contact  number available for                            emergencies. The signs and symptoms of potential                            delayed complications were discussed with the                            patient. Return to normal activities tomorrow.                            Written discharge instructions were provided to the                            patient.                           - Resume previous diet.                           - Continue present medications.                           -  Await pathology results.                           - Repeat colonoscopy in 5 years for surveillance                            based on pathology results.                           - Avoid or limit use of ibuprofen, naproxen, or                            other non-steroidal anti-inflammatory drugs. Mauri Pole, MD 09/16/2019 9:00:38 AM This report has been signed electronically.

## 2019-09-16 NOTE — Patient Instructions (Signed)
YOU HAD AN ENDOSCOPIC PROCEDURE TODAY AT THE [facility]:   Refer to the procedure report that was given to you for any specific questions about what was found during the examination.  If the procedure report does not answer your questions, please call your gastroenterologist to clarify.  If you requested that your care partner not be given the details of your procedure findings, then the procedure report has been included in a sealed envelope for you to review at your convenience later.  YOU SHOULD EXPECT: Some feelings of bloating in the abdomen. Passage of more gas than usual.  Walking can help get rid of the air that was put into your GI tract during the procedure and reduce the bloating. If you had a lower endoscopy (such as a colonoscopy or flexible sigmoidoscopy) you may notice spotting of blood in your stool or on the toilet paper. If you underwent a bowel prep for your procedure, you may not have a normal bowel movement for a few days.  Please Note:  You might notice some irritation and congestion in your nose or some drainage.  This is from the oxygen used during your procedure.  There is no need for concern and it should clear up in a day or so.  SYMPTOMS TO REPORT IMMEDIATELY:   Following lower endoscopy (colonoscopy or flexible sigmoidoscopy):  Excessive amounts of blood in the stool  Significant tenderness or worsening of abdominal pains  Swelling of the abdomen that is new, acute  Fever of 100F or higher  For urgent or emergent issues, a gastroenterologist can be reached at any hour by calling [phone].   DIET:  We do recommend a small meal at first, but then you may proceed to your regular diet.  Drink plenty of fluids but you should avoid alcoholic beverages for 24 hours.  ACTIVITY:  You should plan to take it easy for the rest of today and you should NOT DRIVE or use heavy machinery until tomorrow (because of the sedation medicines used during the test).     FOLLOW UP: Our staff will call the number listed on your records 48-72 hours following your procedure to check on you and address any questions or concerns that you may have regarding the information given to you following your procedure. If we do not reach you, we will leave a message.  We will attempt to reach you two times.  During this call, we will ask if you have developed any symptoms of COVID 19. If you develop any symptoms (ie: fever, flu-like symptoms, shortness of breath, cough etc.) before then, please call [phone].  If you test positive for Covid 19 in the 2 weeks post procedure, please call and report this information to us.    If any biopsies were taken you will be contacted by phone or by letter within the next 1-3 weeks.  Please call us at [phone] if you have not heard about the biopsies in 3 weeks.    SIGNATURES/CONFIDENTIALITY: You and/or your care partner have signed paperwork which will be entered into your electronic medical record.  These signatures attest to the fact that that the information above on your After Visit Summary has been reviewed and is understood.  Full responsibility of the confidentiality of this discharge information lies with you and/or your care-partner. 

## 2019-09-16 NOTE — Progress Notes (Signed)
PT taken to PACU. Monitors in place. VSS. Report given to RN. 

## 2019-09-18 ENCOUNTER — Telehealth: Payer: Self-pay

## 2019-09-18 NOTE — Telephone Encounter (Signed)
  Follow up Call-  Call back number 09/16/2019  Post procedure Call Back phone  # [phone]  Permission to leave phone message Yes  Some recent data might be hidden     Patient questions:  Do you have a fever, pain , or abdominal swelling? No. Pain Score  0 *  Have you tolerated food without any problems? Yes.    Have you been able to return to your normal activities? Yes.    Do you have any questions about your discharge instructions: Diet   No. Medications  No. Follow up visit  No.  Do you have questions or concerns about your Care? No.  Actions: * If pain score is 4 or above: No action needed, pain <4.  1. Have you developed a fever since your procedure? no  2.   Have you had an respiratory symptoms (SOB or cough) since your procedure? no  3.   Have you tested positive for COVID 19 since your procedure no  4.   Have you had any family members/close contacts diagnosed with the COVID 19 since your procedure?  no   If yes to any of these questions please route to Joylene John, RN and Alphonsa Gin, Therapist, sports.

## 2019-09-19 DIAGNOSIS — M79674 Pain in right toe(s): Secondary | ICD-10-CM | POA: Diagnosis not present

## 2019-09-19 DIAGNOSIS — L600 Ingrowing nail: Secondary | ICD-10-CM | POA: Diagnosis not present

## 2019-09-19 DIAGNOSIS — M79675 Pain in left toe(s): Secondary | ICD-10-CM | POA: Diagnosis not present

## 2019-09-19 DIAGNOSIS — B351 Tinea unguium: Secondary | ICD-10-CM | POA: Diagnosis not present

## 2019-09-24 ENCOUNTER — Encounter: Payer: Self-pay | Admitting: Gastroenterology

## 2020-01-25 ENCOUNTER — Ambulatory Visit: Payer: BC Managed Care – PPO | Attending: Internal Medicine

## 2020-01-25 DIAGNOSIS — Z23 Encounter for immunization: Secondary | ICD-10-CM

## 2020-01-25 NOTE — Progress Notes (Signed)
   Covid-19 Vaccination Clinic  Name:  SAITH OWSIANY    MRN: [id number] DOB: 30-Mar-1961  01/25/2020  Mr. Blomme was observed post Covid-19 immunization for 15 minutes without incident. He was provided with Vaccine Information Sheet and instruction to access the V-Safe system.   Mr. Lapid was instructed to call 911 with any severe reactions post vaccine: Marland Kitchen Difficulty breathing  . Swelling of face and throat  . A fast heartbeat  . A bad rash all over body  . Dizziness and weakness   Immunizations Administered    Name Date Dose VIS Date Route   Moderna COVID-19 Vaccine 01/25/2020 10:11 AM 0.5 mL 10/08/2019 Intramuscular   Manufacturer: Moderna   Lot: [id number]   [id number]

## 2020-02-26 ENCOUNTER — Ambulatory Visit: Payer: BC Managed Care – PPO | Attending: Internal Medicine

## 2020-02-26 DIAGNOSIS — Z23 Encounter for immunization: Secondary | ICD-10-CM

## 2020-02-26 NOTE — Progress Notes (Signed)
   Covid-19 Vaccination Clinic  Name:  Stephen Hines    MRN: [id number] DOB: [birth year]  02/26/2020  Stephen Hines was observed post Covid-19 immunization for 15 minutes without incident. He was provided with Vaccine Information Sheet and instruction to access the V-Safe system.   Stephen Hines was instructed to call 911 with any severe reactions post vaccine: Marland Kitchen Difficulty breathing  . Swelling of face and throat  . A fast heartbeat  . A bad rash all over body  . Dizziness and weakness   Immunizations Administered    Name Date Dose VIS Date Route   Moderna COVID-19 Vaccine 02/26/2020  8:48 AM 0.5 mL 10/2019 Intramuscular   Manufacturer: Moderna   Lot: [id number]   [id number]

## 2021-03-16 ENCOUNTER — Other Ambulatory Visit (HOSPITAL_COMMUNITY): Payer: Self-pay

## 2022-12-30 ENCOUNTER — Ambulatory Visit: Payer: 59 | Admitting: Podiatry

## 2022-12-30 DIAGNOSIS — B351 Tinea unguium: Secondary | ICD-10-CM

## 2022-12-30 NOTE — Addendum Note (Signed)
Addended by: Edrick Kins on: 12/30/2022 03:38 PM   Modules accepted: Level of Service

## 2022-12-30 NOTE — Addendum Note (Signed)
Addended by: Edrick Kins on: 12/30/2022 03:37 PM   Modules accepted: Level of Service

## 2022-12-30 NOTE — Progress Notes (Signed)
   Chief Complaint  Patient presents with   Nail Problem    Nail fungus, started 3 years ago, patient denies any pain, seen PCP tried Lamisil, itraconazole(pulse treatment) Jublia , nails are thick and yellow     Subjective: 62 y.o. male presenting today as a new patient for evaluation of thickening and discoloration to the toenails that has been ongoing for the last 3 years.  He has been working closely with his PCP for treatment of toenail fungus.  He has been on oral Lamisil, oral itraconazole, topical Jublia which has cured all of his toenails except for his bilateral great toes.  There is been no improvement in the bilateral great toenails.  He presents for further treatment evaluation  Past Medical History:  Diagnosis Date   Hx of colonic polyps    Hyperlipidemia     Past Surgical History:  Procedure Laterality Date   COLONOSCOPY     POLYPECTOMY     UPPER GASTROINTESTINAL ENDOSCOPY     VASECTOMY      No Known Allergies  Objective: Physical Exam General: The patient is alert and oriented x3 in no acute distress.  Dermatology: Hyperkeratotic, discolored, thickened, onychodystrophy noted to the bilateral great toes. Skin is warm, dry and supple bilateral lower extremities. Negative for open lesions or macerations.  Vascular: Palpable pedal pulses bilaterally. No edema or erythema noted. Capillary refill within normal limits.  Neurological: Epicritic and protective threshold grossly intact bilaterally.   Musculoskeletal Exam: No pedal deformity noted  Assessment: #1  Nail dystrophy vs. onychomycosis of toenails bilateral great toes -Patient evaluated -The patient has been on multiple rounds of oral antifungal medication and topical including oral terbinafine 228m x 90 days, pulsed treatment of itraconazole, topical Jublia, and the patient has persistent thickening with nail dystrophy to the bilateral great toenails.  He says that the antifungal medication treatments  resolved in the dystrophy to the lesser digits bilaterally.  The only remaining nail dystrophy with discoloration is now the great toes. -Patient also does relate a history of persistent trauma to the bilateral great toenails.  I explained that I do believe the appearance of the nail plates is likely more consistent with a nail dystrophy due to history of microtrauma versus onychomycosis of the toenails since there was no improvement with oral antifungal medication. -Debridement of the bilateral toenails was performed today using a nail nipper without incident or bleeding.  A portion of the right and left great toenail plates was placed in a specimen container and sent to pathology for fungal culture -will plan to call the patient once results are available to discuss the fungal nail culture results and discuss further treatment options -Return to clinic as needed   BEdrick Kins DPM [facility]  Dr. BEdrick Kins DPM    [address] [place] [id number]               Office [phone] Fax [phone]

## 2023-12-04 ENCOUNTER — Ambulatory Visit
Admission: EM | Admit: 2023-12-04 | Discharge: 2023-12-04 | Disposition: A | Discharge status: Home / Self Care | Payer: 59 | Attending: Internal Medicine | Admitting: Internal Medicine

## 2023-12-04 ENCOUNTER — Encounter: Payer: Self-pay | Admitting: Emergency Medicine

## 2023-12-04 DIAGNOSIS — J069 Acute upper respiratory infection, unspecified: Secondary | ICD-10-CM | POA: Insufficient documentation

## 2023-12-04 DIAGNOSIS — B9789 Other viral agents as the cause of diseases classified elsewhere: Secondary | ICD-10-CM | POA: Insufficient documentation

## 2023-12-04 DIAGNOSIS — R509 Fever, unspecified: Secondary | ICD-10-CM | POA: Insufficient documentation

## 2023-12-04 LAB — POCT INFLUENZA A/B
Influenza A, POC: NEGATIVE
Influenza B, POC: NEGATIVE

## 2023-12-04 MED ORDER — IBUPROFEN 800 MG PO TABS
800.0000 mg | ORAL_TABLET | Freq: Once | ORAL | Status: AC
  Administered 2023-12-04: 800 mg via ORAL

## 2023-12-04 MED ORDER — PROMETHAZINE-DM 6.25-15 MG/5ML PO SYRP: 5.0000 mL | ORAL_SOLUTION | Freq: Every evening | ORAL | 0 refills | Status: DC | PRN

## 2023-12-04 MED ORDER — ACETAMINOPHEN 325 MG PO TABS
975.0000 mg | ORAL_TABLET | Freq: Once | ORAL | Status: AC
  Administered 2023-12-04: 975 mg via ORAL

## 2023-12-04 NOTE — Discharge Instructions (Signed)
You have a viral illness which will improve on its own with rest, fluids, and medications to help with your symptoms.  Tylenol, guaifenesin (plain mucinex), and saline nasal sprays may help relieve symptoms.  Promethazine DM at bedtime as needed for cough.   Two teaspoons of honey in 1 cup of warm water every 4-6 hours may help with throat pains.  Humidifier in room at nighttime may help soothe cough (clean well daily).   For chest pain, shortness of breath, inability to keep food or fluids down without vomiting, fever that does not respond to tylenol or motrin, or any other severe symptoms, please go to the ER for further evaluation. Return to urgent care as needed, otherwise follow-up with PCP.

## 2023-12-04 NOTE — ED Triage Notes (Addendum)
Pt c/o cough and fever for two days. Did at home covid test today that was negative

## 2023-12-04 NOTE — ED Provider Notes (Signed)
[facility]    CSN: [id number] Arrival date & time: 12/04/23  1616      History   Chief Complaint Chief Complaint  Patient presents with   Cough    HPI Stephen Hines is a 63 y.o. male.   Patient presents to urgent care for evaluation of cough that started 2 days ago.  He has had intermittent body aches but attributes this to chills and fever at home.  He has not taken his temperature at home.  Fever currently 102.7 without any recent antipyretic use.  Denies nasal congestion, sore throat, dizziness, ear pain, headache, chest pain, heart palpitations, shortness of breath, nausea, vomiting, diarrhea, abdominal pain, rash, and recent sick contacts with similar symptoms.  Never smoker, denies history of chronic respiratory problems.  Taking over-the-counter medications without much relief of symptoms.   Cough   Past Medical History:  Diagnosis Date   Hx of colonic polyps    Hyperlipidemia     There are no active problems to display for this patient.   Past Surgical History:  Procedure Laterality Date   COLONOSCOPY     POLYPECTOMY     UPPER GASTROINTESTINAL ENDOSCOPY     VASECTOMY         Home Medications    Prior to Admission medications   Medication Sig Start Date End Date Taking? Authorizing Provider  amLODipine (NORVASC) 5 MG tablet Take 5 mg by mouth daily. 11/21/23  Yes [provider]  losartan (COZAAR) 100 MG tablet Take 100 mg by mouth daily. 11/17/23  Yes [provider]  promethazine-dextromethorphan (PROMETHAZINE-DM) 6.25-15 MG/5ML syrup Take 5 mLs by mouth at bedtime as needed for cough. 12/04/23  Yes Carlisle Beers, FNP  atorvastatin (LIPITOR) 20 MG tablet Take 20 mg by mouth daily.    [provider]  esomeprazole (NEXIUM) 40 MG capsule Take 40 mg by mouth daily at 12 noon. Patient not taking: Reported on 12/30/2022    [provider]  JUBLIA 10 % SOLN APPLY TO AFFECTED TOENAILS EVERY DAY Patient  not taking: Reported on 12/30/2022 06/18/19   [provider]  NAFTIN 2 % GEL APPLY TO FEET TWICE A DAY Patient not taking: Reported on 12/30/2022 06/18/19   [provider]  ranitidine (ZANTAC) 300 MG tablet TAKE 1 TABLET BY MOUTH EVERYDAY AT BEDTIME Patient not taking: Reported on 09/02/2019 06/22/18   Napoleon Form, MD  terbinafine (LAMISIL) 250 MG tablet TAKE 1 DAILY WITH FOOD FOR 3RD MONTH OF TREATMENT Patient not taking: Reported on 12/30/2022 08/13/19   [provider]    Family History Family History  Problem Relation Age of Onset   Hypertension Brother    Colon cancer Neg Hx    Colon polyps Neg Hx    Esophageal cancer Neg Hx    Rectal cancer Neg Hx    Stomach cancer Neg Hx     Social History Social History   Tobacco Use   Smoking status: Never   Smokeless tobacco: Never  Vaping Use   Vaping status: Never Used  Substance Use Topics   Alcohol use: Yes    Alcohol/week: 0.0 standard drinks of alcohol   Drug use: No     Allergies   Patient has no known allergies.   Review of Systems Review of Systems  Respiratory:  Positive for cough.   Per HPI   Physical Exam Triage Vital Signs ED Triage Vitals  Encounter Vitals Group     BP 12/04/23 1701 Marland Kitchen)  157/93     Systolic BP Percentile --      Diastolic BP Percentile --      Pulse Rate 12/04/23 1701 (!) 118     Resp 12/04/23 1701 16     Temp 12/04/23 1701 (!) 102.7 F (39.3 C)     Temp src --      SpO2 12/04/23 1701 91 %     Weight --      Height --      Head Circumference --      Peak Flow --      Pain Score 12/04/23 1708 2     Pain Loc --      Pain Education --      Exclude from Growth Chart --    No data found.  Updated Vital Signs BP (!) 157/93 (BP Location: Right Arm)   Pulse (!) 118   Temp (!) 102.7 F (39.3 C)   Resp 16   SpO2 91%   Visual Acuity Right Eye Distance:   Left Eye Distance:   Bilateral Distance:    Right Eye Near:   Left Eye Near:    Bilateral  Near:     Physical Exam Vitals and nursing note reviewed.  Constitutional:      Appearance: He is ill-appearing. He is not toxic-appearing.  HENT:     Head: Normocephalic and atraumatic.     Right Ear: Hearing, tympanic membrane, ear canal and external ear normal.     Left Ear: Hearing, tympanic membrane, ear canal and external ear normal.     Nose: Nose normal.     Mouth/Throat:     Lips: Pink.     Mouth: Mucous membranes are moist. No injury or oral lesions.     Dentition: Normal dentition.     Tongue: No lesions.     Pharynx: Oropharynx is clear. Uvula midline. No pharyngeal swelling, oropharyngeal exudate, posterior oropharyngeal erythema, uvula swelling or postnasal drip.     Tonsils: No tonsillar exudate.  Eyes:     General: Lids are normal. Vision grossly intact. Gaze aligned appropriately.     Extraocular Movements: Extraocular movements intact.     Conjunctiva/sclera: Conjunctivae normal.  Neck:     Trachea: Trachea and phonation normal.  Cardiovascular:     Rate and Rhythm: Normal rate and regular rhythm.     Heart sounds: Normal heart sounds, S1 normal and S2 normal.  Pulmonary:     Effort: Pulmonary effort is normal. No respiratory distress.     Breath sounds: Normal breath sounds and air entry.  Musculoskeletal:     Cervical back: Neck supple.  Lymphadenopathy:     Cervical: No cervical adenopathy.  Skin:    General: Skin is warm and dry.     Capillary Refill: Capillary refill takes less than 2 seconds.     Findings: No rash.  Neurological:     General: No focal deficit present.     Mental Status: He is alert and oriented to person, place, and time. Mental status is at baseline.     Cranial Nerves: No dysarthria or facial asymmetry.  Psychiatric:        Mood and Affect: Mood normal.        Speech: Speech normal.        Behavior: Behavior normal.        Thought Content: Thought content normal.        Judgment: Judgment normal.      UC Treatments /  Results  Labs (all labs ordered are listed, but only abnormal results are displayed) Labs Reviewed  SARS CORONAVIRUS 2 (TAT 6-24 HRS)  POCT INFLUENZA A/B    EKG   Radiology No results found.  Procedures Procedures (including critical care time)  Medications Ordered in UC Medications  acetaminophen (TYLENOL) tablet 975 mg (975 mg Oral Given 12/04/23 1714)  ibuprofen (ADVIL) tablet 800 mg (800 mg Oral Given 12/04/23 1813)    Initial Impression / Assessment and Plan / UC Course  I have reviewed the triage vital signs and the nursing notes.  Pertinent labs & imaging results that were available during my care of the patient were reviewed by me and considered in my medical decision making (see chart for details).   1.  Viral URI with cough, fever Suspect viral URI, viral syndrome.  Strep/viral testing: Point-of-care influenza testing is negative.  At home test for COVID was negative this morning per patient.  PCR COVID test is pending.  Patient is a candidate for antiviral if positive.  Tylenol given in clinic which reduced fever to 101.6. Ibuprofen given prior to discharge to further reduce fever.  Physical exam findings reassuring, vital signs hemodynamically stable, and lungs clear, therefore deferred imaging of the chest.  Advised supportive care/prescriptions for symptomatic relief as outlined in AVS.   Counseled patient on potential for adverse effects with medications prescribed/recommended today, strict ER and return-to-clinic precautions discussed, patient verbalized understanding.    Final Clinical Impressions(s) / UC Diagnoses   Final diagnoses:  Viral URI with cough  Fever, unspecified     Discharge Instructions      You have a viral illness which will improve on its own with rest, fluids, and medications to help with your symptoms.  Tylenol, guaifenesin (plain mucinex), and saline nasal sprays may help relieve symptoms.  Promethazine-DM at bedtime as needed  for cough.  Two teaspoons of honey in 1 cup of warm water every 4-6 hours may help with throat pains.  Humidifier in room at nighttime may help soothe cough (clean well daily).   For chest pain, shortness of breath, inability to keep food or fluids down without vomiting, fever that does not respond to tylenol or motrin, or any other severe symptoms, please go to the ER for further evaluation. Return to urgent care as needed, otherwise follow-up with PCP.     ED Prescriptions     Medication Sig Dispense Auth. Provider   promethazine-dextromethorphan (PROMETHAZINE-DM) 6.25-15 MG/5ML syrup Take 5 mLs by mouth at bedtime as needed for cough. 118 mL Carlisle Beers, FNP      PDMP not reviewed this encounter.   Carlisle Beers, [place] 12/04/23 1818

## 2023-12-05 LAB — SARS CORONAVIRUS 2 (TAT 6-24 HRS): SARS Coronavirus 2: NEGATIVE

## 2024-02-26 ENCOUNTER — Encounter: Payer: Self-pay | Admitting: Internal Medicine

## 2024-02-26 ENCOUNTER — Ambulatory Visit: Payer: BC Managed Care – PPO | Admitting: Internal Medicine

## 2024-02-26 VITALS — BP 140/88 | HR 69 | Temp 98.0°F | Ht 74.0 in | Wt 278.0 lb

## 2024-02-26 DIAGNOSIS — E669 Obesity, unspecified: Secondary | ICD-10-CM | POA: Diagnosis not present

## 2024-02-26 DIAGNOSIS — E782 Mixed hyperlipidemia: Secondary | ICD-10-CM

## 2024-02-26 DIAGNOSIS — I10 Essential (primary) hypertension: Secondary | ICD-10-CM | POA: Insufficient documentation

## 2024-02-26 DIAGNOSIS — Z0000 Encounter for general adult medical examination without abnormal findings: Secondary | ICD-10-CM | POA: Diagnosis not present

## 2024-02-26 DIAGNOSIS — E785 Hyperlipidemia, unspecified: Secondary | ICD-10-CM | POA: Insufficient documentation

## 2024-02-26 DIAGNOSIS — B351 Tinea unguium: Secondary | ICD-10-CM | POA: Insufficient documentation

## 2024-02-26 DIAGNOSIS — R609 Edema, unspecified: Secondary | ICD-10-CM | POA: Insufficient documentation

## 2024-02-26 DIAGNOSIS — B977 Papillomavirus as the cause of diseases classified elsewhere: Secondary | ICD-10-CM | POA: Insufficient documentation

## 2024-02-26 NOTE — Assessment & Plan Note (Signed)
 Hyperlipidemia is managed with lifestyle modifications and regular monitoring.

## 2024-02-26 NOTE — Assessment & Plan Note (Signed)
 Hypertension remains somewhat uncontrolled with elevated home blood pressure readings, and white coat syndrome is noted. There is a potential link between weight and hypertension through sleep apnea. Recommend purchasing an Apple Watch 9 to monitor sleep apnea as a cost-effective alternative to traditional sleep studies. Advise monitoring blood pressure and waist circumference at home. Encourage resistance training and physical activity. Consider a home sleep study if necessary.

## 2024-02-26 NOTE — Assessment & Plan Note (Signed)
 He has numerous skin tags and left ear wart I encouraged patient to have removed elsewhere or here at his leisure.

## 2024-02-26 NOTE — Progress Notes (Signed)
 [facility] [address] [place], [place] [address] Office:  [phone]  -- Annual Preventive Medical Office Visit --  Patient:  Stephen Hines      Age: 63 y.o.       Sex:  male  Date:   02/26/2024 Patient Care Team: Anthon Kins, MD as PCP - General (Internal Medicine) Today's Healthcare Provider: Anthon Kins, MD  ========================================= Chief complaint: new pt (Pt states he is here to est care with pcp.) Has no problems for which he seeks immediate management , just wants to stay health and avoid complications so we did visit as Comprehensive Physical Exam (CPE) preventive care annual visit.  Purpose of Visit: Comprehensive preventive health assessment and personalized health maintenance planning.  This encounter was conducted as a Comprehensive Physical Exam (CPE) preventive care annual visit. The patient's medical history and problem list were reviewed to inform individualized preventive care recommendations.  No problem-specific medical treatment was provided during this visit.     Assessment & Plan Encounter for annual health examination An annual physical examination was conducted. Preventive care and insurance implications for annual visits versus problem-based visits were discussed. Recommend making the visit an annual physical to potentially cover lab work and the visit itself, with the option to bill as a problem-based visit if insurance denies coverage.  General Health Maintenance   General health maintenance was discussed, including regular eye and dental care, and the use of saline nasal spray for allergies. A CT coronary calcium score test for heart disease risk assessment is available for $100 and not covered by insurance.  Follow-up   Follow-up plans include using MyChart for ongoing monitoring and medication adjustments based on home data. MyChart visits for data submission and medication adjustments are  cost-effective compared to traditional visits. Obesity (BMI 30-39.9) Encouraged weight loss  Mixed hyperlipidemia Hyperlipidemia is managed with lifestyle modifications and regular monitoring. Hypertension, unspecified type Hypertension remains somewhat uncontrolled with elevated home blood pressure readings, and white coat syndrome is noted. There is a potential link between weight and hypertension through sleep apnea. Recommend purchasing an Apple Watch 9 to monitor sleep apnea as a cost-effective alternative to traditional sleep studies. Advise monitoring blood pressure and waist circumference at home. Encourage resistance training and physical activity. Consider a home sleep study if necessary. Swelling In thyroid area, image taken- feels like lipomatous mass to palpation we will watch Photographs Taken 02/26/2024 :    HPV (human papilloma virus) infection He has numerous skin tags and left ear wart I encouraged patient to have removed elsewhere or here at his leisure. Onychomycosis Chronic onychomycosis of the toenail persists despite previous treatment with terbinafine. Recent A1c testing suggests diabetes is unlikely.    Reviewed/updated/encouraged completion: Immunization History  Administered Date(s) Administered   Moderna Sars-Covid-2 Vaccination 01/25/2020, 02/26/2020   Rabies, IM 04/30/2019, 05/03/2019, 05/07/2019, 05/14/2019   Tdap 04/30/2019   There are no preventive care reminders to display for this patient. Health Maintenance  Topic Date Due   COVID-19 Vaccine (3 - 2024-25 season) 03/13/2024 (Originally 07/09/2023)   Zoster Vaccines- Shingrix (1 of 2) 05/27/2024 (Originally 06/10/2010)   Hepatitis C Screening  02/25/2025 (Originally 06/10/1978)   HIV Screening  02/25/2025 (Originally 06/11/1975)   INFLUENZA VACCINE  06/07/2024   Colonoscopy  09/15/2024   DTaP/Tdap/Td (2 - Td or Tdap) 04/29/2029   HPV VACCINES  Aged Out   Meningococcal B Vaccine  Aged Out    Reviewed  the following verbally  with patient and provided AVS materials:  HEALTH MAINTENANCE COUNSELING AND ANTICIPATORY GUIDANCE   Preventive Measure Recommendation  Eye Exams Every 1-2 years  Dental Care Cleanings every 6 months or more, brush/floss 3x daily  Sinus Care Saline spray rinses daily  Sleep 8 hours nightly, good sleep hygiene, e-monitoring if any daytime drowsiness- encouraged patient to applewatch this and also consider emay oxygen sensor.  Diet Fruits/vegetables/fiber/healthy fats, balance and moderation  Exercise 150 minutes weekly  Risk Behaviors Discouraged any/all high risk behaviors   CANCER SCREENING SHARED DECISION MAKING   Penile/Testicle/Scrotum Encouraged self-monitoring and reporting of genital abnormalities. Patient reports none.  Thyroid Checked and advised to palpate thyroid for nodules  Prostate Individualized risks/benefits/costs discussed   PSA RESULTS: Lab Results  Component Value Date   PSA 1.22 05/07/2015  We decided against labs today via shared decision making- until we see how insurance will do. He had labs at work but N/A to us  to see if done recently.      Colon HM Colonoscopy          Upcoming     Colonoscopy (Every 5 Years) Next due on 09/15/2024    09/16/2019  COLONOSCOPY   Only the first 1 history entries have been loaded, but more history exists. Due for another this year but has referral already doesn't need 1 from us                 Lung Current guidelines recommend individuals aged 63 to 19 who currently smoke or formerly smoked and have a >= 20 pack-year smoking history should undergo annual screening with low-dose computed tomography (LDCT). Tobacco Use: Low Risk  (02/26/2024)   Patient History    Smoking Tobacco Use: Never    Smokeless Tobacco Use: Never    Passive Exposure: Not on file    Skin Advised regular sunscreen use. Patient denies worrisome, changing, or new skin lesions.   Other Cancers Discussed lack of screening  guidelines and insurance coverage for other cancer types.  Specifically MRI whole body.     IMPORTANT HEALTH REMINDERS: Report any new or changing skin lesions promptly Maintain recommended screening schedules Discuss any new family history of cancer at future visits Follow up on any new symptoms that persist more than two weeks        Subjective  HPI  History of Present Illness 63 year old male who presents for an annual physical exam.  He has a history of hypertension, initially identified during annual physicals due to 'white coat syndrome'. He monitors his blood pressure at home, noting it can be as high as 170/102 mmHg. He is currently on medication for blood pressure, though the name and dosage are unspecified.  He has hyperlipidemia and is on medication for cholesterol management, but the specific medication and dosage were not mentioned.  He previously experienced heartburn but is no longer taking medication for it and reports no current symptoms.  He has a persistent toenail fungus for which he used Jublia and a 12-week course of terbinafine five years ago, but the condition persists. He questions if diabetes might be an underlying issue, though he has been screened for diabetes with an A1c test and reports no diagnosis.  He is concerned about his weight and is actively working on [employer]. He engages in physical activity, including walking 10,000 steps a day and seeing a trainer bi-weekly. He has been less active recently due to family obligations and concerns about illness exposure at the gym.  He has not been diagnosed with sleep apnea. No significant daytime drowsiness is reported.     Review of Systems  Constitutional:  Negative for chills, diaphoresis, fever, malaise/fatigue and weight loss.  HENT:  Negative for congestion, ear discharge, ear pain, hearing loss, nosebleeds, sinus pain, sore throat and tinnitus.   Eyes:  Negative for blurred vision, double  vision, photophobia, pain, discharge and redness.  Respiratory:  Negative for cough, hemoptysis, sputum production, shortness of breath, wheezing and stridor.   Cardiovascular:  Negative for chest pain, palpitations, orthopnea, claudication, leg swelling and PND.  Gastrointestinal:  Negative for abdominal pain, blood in stool, constipation, diarrhea, heartburn, melena, nausea and vomiting.  Genitourinary:  Negative for dysuria, flank pain, frequency, hematuria and urgency.  Musculoskeletal:  Negative for back pain, falls, joint pain, myalgias and neck pain.  Skin:  Negative for itching and rash.  Neurological:  Negative for dizziness, tingling, tremors, sensory change, speech change, focal weakness, seizures, loss of consciousness, weakness and headaches.  Endo/Heme/Allergies:  Negative for environmental allergies and polydipsia. Does not bruise/bleed easily.  Psychiatric/Behavioral:  Negative for depression, hallucinations, memory loss, substance abuse and suicidal ideas. The patient is not nervous/anxious and does not have insomnia.     Completed medication reconciliation: Current Outpatient Medications on File Prior to Visit  Medication Sig   amLODipine (NORVASC) 5 MG tablet Take 5 mg by mouth daily.   atorvastatin (LIPITOR) 20 MG tablet Take 20 mg by mouth daily.   No current facility-administered medications on file prior to visit.   Medications Discontinued During This Encounter  Medication Reason   esomeprazole (NEXIUM) 40 MG capsule Completed Course   ranitidine  (ZANTAC ) 300 MG tablet Completed Course   terbinafine (LAMISIL) 250 MG tablet Completed Course   JUBLIA 10 % SOLN Completed Course   NAFTIN 2 % GEL Completed Course   losartan (COZAAR) 100 MG tablet Completed Course   promethazine -dextromethorphan (PROMETHAZINE -DM) 6.25-15 MG/5ML syrup Completed Course  The following were reviewed and/or entered/updated into our electronic MEDICAL RECORD NUMBERPast Medical History:   Diagnosis Date   Hx of colonic polyps    Hyperlipidemia    Hypertension    Past Surgical History:  Procedure Laterality Date   COLONOSCOPY     POLYPECTOMY     UPPER GASTROINTESTINAL ENDOSCOPY     VASECTOMY     Social History   Socioeconomic History   Marital status: Married    Spouse name: Not on file   Number of children: 2   Years of education: Not on file   Highest education level: Not on file  Occupational History   Occupation: Charity fundraiser  Tobacco Use   Smoking status: Never   Smokeless tobacco: Never  Vaping Use   Vaping status: Never Used  Substance and Sexual Activity   Alcohol use: Yes    Alcohol/week: 0.0 standard drinks of alcohol   Drug use: No   Sexual activity: Yes  Other Topics Concern   Not on file  Social History Narrative   [employer] -- does all physical exams   Social Drivers of Health   Financial Resource Strain: Not on file  Food Insecurity: Not on file  Transportation Needs: Not on file  Physical Activity: Not on file  Stress: Not on file  Social Connections: Not on file  Intimate Partner Violence: Not on file   Family History  Problem Relation Age of Onset   Hypertension Brother    Colon cancer Neg Hx    Colon polyps Neg Hx  Esophageal cancer Neg Hx    Rectal cancer Neg Hx    Stomach cancer Neg Hx   No Known Allergies Social History   Substance and Sexual Activity  Sexual Activity Yes   Social History   Tobacco Use   Smoking status: Never   Smokeless tobacco: Never  Vaping Use   Vaping status: Never Used  Substance Use Topics   Alcohol use: Yes    Alcohol/week: 0.0 standard drinks of alcohol   Drug use: No      02/26/2024    9:58 AM  Depression screen PHQ 2/9  Decreased Interest 0  Down, Depressed, Hopeless 0  PHQ - 2 Score 0  Altered sleeping 0  Tired, decreased energy 0  Change in appetite 0  Feeling bad or failure about yourself  0  Trouble concentrating 0  Moving slowly or fidgety/restless 0  Suicidal  thoughts 0  PHQ-9 Score 0  Difficult doing work/chores Not difficult at all    Objective  BP (!) 140/88   Pulse 69   Temp 98 F (36.7 C) (Temporal)   Ht 6\' 2"  (1.88 m)   Wt 278 lb (126.1 kg)   SpO2 98%   BMI 35.69 kg/m  BP Readings from Last 3 Encounters:  02/26/24 (!) 140/88  12/04/23 (!) 157/93  09/16/19 (!) 140/95   Wt Readings from Last 10 Encounters:  02/26/24 278 lb (126.1 kg)  09/16/19 264 lb (119.7 kg)  09/02/19 264 lb 6.4 oz (119.9 kg)  05/07/19 257 lb 15 oz (117 kg)  04/30/19 260 lb (117.9 kg)  10/13/17 281 lb (127.5 kg)  03/22/17 271 lb (122.9 kg)  10/04/16 265 lb (120.2 kg)  09/19/16 265 lb 6 oz (120.4 kg)  06/23/16 264 lb (119.7 kg)  GEN: No acute distress, resting comfortably. HEENT: Tympanic membranes normal appearing bilaterally, oropharynx clear, no thyromegaly noted, no palpable lymphadenopathy or thyroid nodules. CARDIOVASCULAR: S1 and S2 heart sounds with regular rate and rhythm, no murmurs appreciated. PULMONARY: Normal work of breathing, clear to auscultation bilaterally, no crackles, wheezes, or rhonchi. ABDOMEN: Soft, nontender, nondistended. MSK: No edema, cyanosis, or clubbing noted. SKIN: Warm, dry, no lesions of concern observed. NEUROLOGICAL: Cranial nerves II-XII grossly intact, strength 5/5 in upper and lower extremities, reflexes symmetric and intact bilaterally. PSYCH: Normal affect and thought content, pleasant and cooperative. Photographs Taken 02/26/2024 :     Results Reviewed: Last depression screening scores    02/26/2024    9:58 AM 03/22/2017    9:20 AM 09/02/2015    6:52 PM  PHQ 2/9 Scores  PHQ - 2 Score 0 0 0  PHQ- 9 Score 0     More recent labs available at workplace we can't access Last CBC Lab Results  Component Value Date   WBC 9.0 05/07/2015   HGB 16.8 05/07/2015   HCT 50.3 05/07/2015   MCV 86.6 05/07/2015   MCH 28.9 05/07/2015   Last metabolic panel Lab Results  Component Value Date   GLUCOSE 94 05/07/2015    NA 143 05/07/2015   K 4.4 05/07/2015   CL 102 05/07/2015   CO2 28 05/07/2015   BUN 12 05/07/2015   CREATININE 1.16 05/07/2015   CALCIUM 9.9 05/07/2015   PROT 7.5 05/07/2015   ALBUMIN 4.7 05/07/2015   BILITOT 1.1 05/07/2015   ALKPHOS 71 05/07/2015   AST 16 05/07/2015   ALT 21 05/07/2015   Last lipids No results found for: "CHOL", "HDL", "LDLCALC", "LDLDIRECT", "TRIG", "CHOLHDL" Last hemoglobin A1c No results found  for: "HGBA1C" Last thyroid functions No results found for: "TSH", "T3TOTAL", "T4TOTAL", "THYROIDAB" Last vitamin D No results found for: "25OHVITD2", "25OHVITD3", "VD25OH" Last vitamin B12 and Folate No results found for: "VITAMINB12", "FOLATE"      ======================================  Notes:  This document was synthesized by artificial intelligence ([employer]) using HIPAA-compliant recording of the clinical interaction;   We discussed the use of AI scribe software for clinical note transcription with the patient, who gave verbal consent to proceed.    This encounter employed state-of-the-art, real-time, collaborative documentation. The patient was empowered to actively review and assist in updating their electronic medical record on a shared monitor, ensuring transparency and improving accuracy.    Prior to and at the beginning of Comprehensive Physical Exam (CPE) preventive care annual visit appointment types  we clarify to patients "Our goal today is to focus on your preventive or annual Comprehensive Physical Exam (CPE) preventive care annual visit, which typically covers routine screenings and overall health maintenance. However, if you share any new or concerning symptoms--such as dizziness, passing out, severe pain, or anything else that may point to a more serious issue--we are both legally and ethically required to evaluate it. We cannot simply overlook or ignore such concerns, even if you later decide you don't want to discuss them, because it could  jeopardize your health.  If addressing a new concern takes us  beyond the scope of the preventive visit, we may need to bill separately for that portion of care. We understand financial considerations are important, and we're happy to discuss your options if something new comes up. However, we want to be clear that once you mention a potentially serious issue, we must investigate it; we can't ethically or legally exclude that from our records or our evaluation. Please let us  know all of your questions or worries. Together, we can decide how best to manage them and how to minimize any unexpected costs, but we want to keep you safe above all else."   This disclosure is mandated by professional ethics and legal obligations, as healthcare providers must address any substantial health concerns raised during any patient interaction and a comprehensive ROS is required by insurance companies for billing preventive-care visit type.   This disclosure ultimately discourages patients financially from reporting significant health issues.

## 2024-02-26 NOTE — Assessment & Plan Note (Signed)
 Encouraged weight loss

## 2024-02-26 NOTE — Assessment & Plan Note (Signed)
 In thyroid area, image taken- feels like lipomatous mass to palpation we will watch Photographs Taken 02/26/2024 :

## 2024-02-26 NOTE — Assessment & Plan Note (Signed)
 Chronic onychomycosis of the toenail persists despite previous treatment with terbinafine. Recent A1c testing suggests diabetes is unlikely.

## 2024-02-26 NOTE — Patient Instructions (Addendum)
 Buy an apple watch 9, monitor self for sleep apnea using health app on phone- send results on MyChart. encouraged patient to applewatch this and also consider emay oxygen sensor.  FOCUS  on waist circumference not weight loss buy tape measure.  Focus on resistance training not cardiology.   Submit this data with home blood pressure.   Consider the 100$ CT coronary calcium score test we have available.      Building Your Long-Term Health Plan  During today's preventive visit, we covered a variety of important health checks to help you stay on top of your well-being.  We also discussed strategies to maintain your health and identified some areas that might benefit from further exploration.   Preventive care visits like today's are designed to be proactive, but sometimes additional attention may be needed.  Rest assured, we're here for you.  If these areas require further evaluation or management, we'd be happy to schedule a separate, focused appointment to address them in detail.  Addressing Next Steps  [x]   Follow-up Visit: To ensure we address any unresolved issues and continue monitoring your overall health, we recommend scheduling a follow-up appointment in 1 year for your next preventive care visit. If you experience any new problems, need to discuss any medical concerns, or your condition worsens before then, please don't hesitate to call our office to schedule an appointment or seek emergency care as needed.  [x]   Preventive Measures: Maintaining healthy habits plays a crucial role in overall wellness. We recommend considering these tips: [x]   Regular appointments with dental and vision professionals [x]   Nightly nasal saline mist to keep sinuses clear [x]   Consistent toothbrushing to maintain oral health [x]   Using an app like [employer]oreLab to track sleep quality [x]   Routine checks of blood pressure and heart rate [x]   Medical Information: In some instances, we may require additional medical  information from other providers to create a comprehensive picture of your health. If applicable, we can provide a medical information release form at the front desk for you to sign, allowing us  to gather these records. [x]   Lab Tests: If any lab tests were ordered today, scheduling them within a week of your visit helps ensure the best possible insurance coverage.  Planning Follow Up to Work on a Problem? Make the Most of Our Focused (20 minute) Appointments  [x]   Clearly state your top concerns at the beginning of the visit to focus our discussion [x]   If you anticipate you will need more time, please inform the front desk during scheduling - we can book multiple appointments in the same week. [x]   If you have transportation problems- use our convenient video appointments or ask about transportation support. [x]   We can get down to business faster if you use MyChart to update information before the visit and submit non-urgent questions before your visit. Thank you for taking the time to provide details through MyChart.  Let our nurse know and she can import this information into your encounter documents.  Arrival and Wait Times  [x]   Arriving on time ensures that everyone receives prompt attention. [x]   Early morning (8a) and afternoon (1p) appointments tend to have shortest wait times. [x]   Unfortunately, we cannot delay appointments for late arrivals or hold slots during phone calls.  Bring to Your Next Appointment:  [x]   Medications: Please bring all your medication bottles to your next appointment to ensure we have an accurate record of your prescriptions. [x]   Health Diaries:  If you're monitoring any health conditions at home, keeping a diary of your readings can be very helpful for discussions at your next appointment.  Reviewing Your Records  [x]   Review your attached preventive care information at the end of these patient instructions. [x]   Review this early draft of your clinical  encounter notes below and the final encounter summary tomorrow on MyChart after its been completed.      Getting Answers and Following Up  [x]   Simple Questions & Concerns: For quick questions or basic follow-up after your visit, reach us  at [phone] or MyChart messaging. [x]   Complex Concerns: If your concern is more complex, scheduling an appointment might be best. Discuss this with the staff to find the most suitable option. [x]   Lab & Imaging Results: We'll contact you directly if results are abnormal or you don't use MyChart. Most normal results will be on MyChart within 2-3 business days, with a review message from Dr. Boston Byers. Haven't heard back in 2 weeks? Need results sooner? Contact us  at [phone]. [x]   Referrals: Our referral coordinator will manage specialist referrals. The specialist's office should contact you within 2 weeks to schedule an appointment. Call us  if you haven't heard from them after 2 weeks.  Staying Connected  [x]   MyChart: Activate your MyChart for the fastest way to access results and message us . See the last page of this paperwork for instructions on how to activate.  Billing  [x]   X-ray & Lab Orders: These are billed by separate companies. Contact the invoicing company directly for questions or concerns. [x]   Visit Charges: Discuss any billing inquiries with our administrative services team.  Your Satisfaction Matters  [x]   Share Your Experience: We strive for your satisfaction! If you have any complaints, or preferably compliments, please let Dr. Boston Byers know directly or contact our Practice Administrators, Olinda Bertrand or [employer], by asking at the front desk.                 Next Steps  [x]   Schedule Follow-Up:  We recommend a follow-up appointment in 1 year for your next wellness visit.  If you develop any new problems, want to address any medical issues, or your condition worsens before then, please call us  for an  appointment or seek emergency care. [x]   Preventive Care:  Make sure to keep regular appointments with dental and vision professionals, use nightly nasal saline mist sprays to keep your sinuses clear and toothbrushing to protect your teeth. Use [employer]ore[employer] App or other app to track your sleep quality. Check blood pressure and heart rate routinely. [x]   Medical Information Release:  For any relevant medical information we don't have, please sign a release form at the front desk so we can obtain it for your records. [x]   Lab Tests:  Schedule any lab tests from today for within a week to ensure best insurance coverage.    Making the Most of Our Focused (20 minute) Appointments:  [x]   Clearly state your top concerns at the beginning of the visit to focus our discussion [x]   If you anticipate you will need more time, please inform the front desk during scheduling - we can book multiple appointments in the same week. [x]   If you have transportation problems- use our convenient video appointments or ask about transportation support. [x]   We can get down to business faster if you use MyChart to update information before the visit and submit non-urgent questions before your visit. Thank  you for taking the time to provide details through MyChart.  Let our nurse know and she can import this information into your encounter documents.  Arrival and Wait Times: [x]   Arriving on time ensures that everyone receives prompt attention. [x]   Early morning (8a) and afternoon (1p) appointments tend to have shortest wait times. [x]   Unfortunately, we cannot delay appointments for late arrivals or hold slots during phone calls.  Bring to Your Next Appointment  [x]   Medications: Please bring all your medication bottles to your next appointment to ensure we have an accurate record of your prescriptions. [x]   Health Diaries: If you're monitoring any health conditions at home, keeping a diary of your readings can be very  helpful for discussions at your next appointment.  Reviewing Your Records  [x]   Review your attached preventive care information at the end of these patient instructions. [x]   Review this early draft of your clinical encounter notes below and the final encounter summary tomorrow on MyChart after its been completed.   Encounter for annual health examination  Obesity (BMI 30-39.9)  Mixed hyperlipidemia  Hypertension, unspecified type  Swelling  HPV (human papilloma virus) infection  Onychomycosis     Getting Answers and Following Up  [x]   Simple Questions & Concerns: For quick questions or basic follow-up after your visit, reach us  at [phone] or MyChart messaging. [x]   Complex Concerns: If your concern is more complex, scheduling an appointment might be best. Discuss this with the staff to find the most suitable option. [x]   Lab & Imaging Results: We'll contact you directly if results are abnormal or you don't use MyChart. Most normal results will be on MyChart within 2-3 business days, with a review message from Dr. Boston Byers. Haven't heard back in 2 weeks? Need results sooner? Contact us  at [phone]. [x]   Referrals: Our referral coordinator will manage specialist referrals. The specialist's office should contact you within 2 weeks to schedule an appointment. Call us  if you haven't heard from them after 2 weeks.  Staying Connected  [x]   MyChart: Activate your MyChart for the fastest way to access results and message us . See the last page of this paperwork for instructions on how to activate.  Billing  [x]   X-ray & Lab Orders: These are billed by separate companies. Contact the invoicing company directly for questions or concerns. [x]   Visit Charges: Discuss any billing inquiries with our administrative services team.  Your Satisfaction Matters  [x]   Share Your Experience: We strive for your satisfaction! If you have any complaints, or preferably compliments, please  let Dr. Boston Byers know directly or contact our Practice Administrators, Olinda Bertrand or [employer], by asking at the front desk.

## 2024-08-14 ENCOUNTER — Encounter: Payer: Self-pay | Admitting: Family Medicine

## 2024-08-15 ENCOUNTER — Encounter: Payer: Self-pay | Admitting: Orthopaedic Surgery

## 2024-08-15 ENCOUNTER — Other Ambulatory Visit: Payer: Self-pay | Admitting: Family Medicine

## 2024-08-15 ENCOUNTER — Other Ambulatory Visit: Payer: Self-pay

## 2024-08-15 ENCOUNTER — Other Ambulatory Visit: Payer: Self-pay | Admitting: Optometry

## 2024-08-15 ENCOUNTER — Encounter: Payer: Self-pay | Admitting: Family Medicine

## 2024-08-15 DIAGNOSIS — R1031 Right lower quadrant pain: Secondary | ICD-10-CM

## 2024-08-15 DIAGNOSIS — G8929 Other chronic pain: Secondary | ICD-10-CM

## 2024-08-22 ENCOUNTER — Ambulatory Visit
Admission: RE | Admit: 2024-08-22 | Discharge: 2024-08-22 | Disposition: A | Discharge status: Home / Self Care | Source: Ambulatory Visit | Attending: Family Medicine | Admitting: Family Medicine

## 2024-08-22 DIAGNOSIS — R1031 Right lower quadrant pain: Secondary | ICD-10-CM

## 2024-08-22 MED ORDER — IOPAMIDOL (ISOVUE-370) INJECTION 76%
75.0000 mL | Freq: Once | INTRAVENOUS | Status: AC | PRN
  Administered 2024-08-22: 75 mL via INTRAVENOUS

## 2024-08-22 MED ORDER — IOPAMIDOL (ISOVUE-370) INJECTION 76%: 75.0000 mL | Freq: Once | INTRAVENOUS | Status: DC | PRN
